# Patient Record
Sex: Female | Born: 1975 | Race: Black or African American | Hispanic: No | Marital: Married | Smoking: Never smoker
Health system: Southern US, Community
[De-identification: ages and names within clinical notes are randomized; demographics above are authoritative.]

## PROBLEM LIST (undated history)

## (undated) ENCOUNTER — Ambulatory Visit: Admission: EM | Payer: Self-pay | Source: Home / Self Care

## (undated) DIAGNOSIS — I10 Essential (primary) hypertension: Secondary | ICD-10-CM

## (undated) HISTORY — PX: HERNIA REPAIR: SHX51

## (undated) HISTORY — DX: Essential (primary) hypertension: I10

---

## 2007-11-28 HISTORY — PX: GASTRIC BYPASS: SHX52

## 2021-04-06 ENCOUNTER — Ambulatory Visit
Admission: EM | Admit: 2021-04-06 | Discharge: 2021-04-06 | Disposition: A | Discharge status: Home / Self Care | Payer: BC Managed Care – PPO | Attending: Emergency Medicine | Admitting: Emergency Medicine

## 2021-04-06 ENCOUNTER — Ambulatory Visit: Payer: BC Managed Care – PPO

## 2021-04-06 ENCOUNTER — Ambulatory Visit (INDEPENDENT_AMBULATORY_CARE_PROVIDER_SITE_OTHER): Payer: BC Managed Care – PPO

## 2021-04-06 ENCOUNTER — Other Ambulatory Visit: Payer: Self-pay

## 2021-04-06 DIAGNOSIS — T07XXXA Unspecified multiple injuries, initial encounter: Secondary | ICD-10-CM | POA: Diagnosis not present

## 2021-04-06 DIAGNOSIS — M545 Low back pain, unspecified: Secondary | ICD-10-CM | POA: Diagnosis not present

## 2021-04-06 DIAGNOSIS — M25562 Pain in left knee: Secondary | ICD-10-CM | POA: Diagnosis not present

## 2021-04-06 DIAGNOSIS — W01198A Fall on same level from slipping, tripping and stumbling with subsequent striking against other object, initial encounter: Secondary | ICD-10-CM | POA: Diagnosis not present

## 2021-04-06 DIAGNOSIS — M25552 Pain in left hip: Secondary | ICD-10-CM

## 2021-04-06 DIAGNOSIS — M25512 Pain in left shoulder: Secondary | ICD-10-CM | POA: Diagnosis not present

## 2021-04-06 DIAGNOSIS — M79644 Pain in right finger(s): Secondary | ICD-10-CM | POA: Diagnosis not present

## 2021-04-06 NOTE — ED Triage Notes (Signed)
Patient states that she was walking out of the grocery store around 9am this morning. States that she tripped over a speed bump. States that she landed on her left side. States that she has left shoulder pain, low back pain, left wrist pain and left hip pain.

## 2021-04-06 NOTE — Discharge Instructions (Addendum)
Use over-the-counter Tylenol and ibuprofen according to the package instructions as needed for pain.  Apply moist heat, or ice, to your areas of pain for 20 minutes at a time 2-3 times a day.  Rest is much as possible.  Return for reevaluation, or see your primary care provider, for any new or worsening symptoms.

## 2021-04-06 NOTE — ED Provider Notes (Signed)
[facility]    CSN: [id number] Arrival date & time: 04/06/21  1315      History   Chief Complaint Chief Complaint  Patient presents with  . Fall    HPI Lauren Wallace is a 45 y.o. female.   HPI   45 year old female here for evaluation of pain status post fall.  Patient reports that she was in the grocery store and tripped over a speed bump which caused her to fall onto the pavement and slide.  She reports that she mostly fell onto her left side and is complaining of left shoulder pain, left upper arm pain, left wrist pain, left hip pain, low back pain, and left knee pain.  She also reports some numbness in her left upper arm.  History reviewed. No pertinent past medical history.  There are no problems to display for this patient.   Past Surgical History:  Procedure Laterality Date  . GASTRIC BYPASS  2009    OB History   No obstetric history on file.      Home Medications    Prior to Admission medications   Not on File    Family History History reviewed. No pertinent family history.  Social History Social History   Tobacco Use  . Smoking status: Never Smoker  . Smokeless tobacco: Never Used  Vaping Use  . Vaping Use: Never used  Substance Use Topics  . Alcohol use: Never  . Drug use: Never     Allergies   Penicillins   Review of Systems Review of Systems  Constitutional: Negative for activity change, appetite change and fever.  Musculoskeletal: Positive for arthralgias, back pain and myalgias.  Skin: Negative for color change and wound.  Neurological: Positive for numbness. Negative for weakness.  Hematological: Negative.      Physical Exam Triage Vital Signs ED Triage Vitals  Enc Vitals Group     BP 04/06/21 1427 (!) 134/92     Pulse Rate 04/06/21 1427 84     Resp 04/06/21 1427 18     Temp 04/06/21 1427 98.4 F (36.9 C)     Temp Source 04/06/21 1427 Oral     SpO2 04/06/21 1427 100 %     Weight 04/06/21 1425 203 lb (92.1  kg)     Height 04/06/21 1425 5\' 7"  (1.702 m)     Head Circumference --      Peak Flow --      Pain Score 04/06/21 1424 8     Pain Loc --      Pain Edu? --      Excl. in GC? --    No data found.  Updated Vital Signs BP (!) 134/92 (BP Location: Right Arm)   Pulse 84   Temp 98.4 F (36.9 C) (Oral)   Resp 18   Ht 5\' 7"  (1.702 m)   Wt 203 lb (92.1 kg)   LMP 04/05/2021   SpO2 100%   BMI 31.79 kg/m   Visual Acuity Right Eye Distance:   Left Eye Distance:   Bilateral Distance:    Right Eye Near:   Left Eye Near:    Bilateral Near:     Physical Exam Vitals and nursing note reviewed.  Constitutional:      General: She is not in acute distress.    Appearance: Normal appearance. She is not ill-appearing.  HENT:     Head: Normocephalic and atraumatic.  Cardiovascular:     Rate and Rhythm: Normal rate and regular rhythm.  Pulses: Normal pulses.     Heart sounds: Normal heart sounds. No murmur heard. No gallop.   Pulmonary:     Effort: Pulmonary effort is normal.     Breath sounds: Normal breath sounds. No wheezing, rhonchi or rales.  Musculoskeletal:        General: Tenderness present. No swelling, deformity or signs of injury.  Skin:    General: Skin is warm and dry.     Capillary Refill: Capillary refill takes less than 2 seconds.     Findings: No bruising or erythema.  Neurological:     General: No focal deficit present.     Mental Status: She is alert and oriented to person, place, and time.  Psychiatric:        Mood and Affect: Mood normal.        Behavior: Behavior normal.        Thought Content: Thought content normal.        Judgment: Judgment normal.      UC Treatments / Results  Labs (all labs ordered are listed, but only abnormal results are displayed) Labs Reviewed - No data to display  EKG   Radiology DG Lumbar Spine Complete  Result Date: 04/06/2021 CLINICAL DATA:  Pain post fall EXAM: LUMBAR SPINE - COMPLETE 4+ VIEW COMPARISON:  None.  FINDINGS: Numerous phleboliths in the pelvis. Lumbar alignment within normal limits. Vertebral body heights are maintained. The disc spaces appear patent. Mild facet degenerative changes. IMPRESSION: No acute osseous abnormality. Electronically Signed   By: Jasmine Pang M.D.   On: 04/06/2021 16:01   DG Shoulder Left  Result Date: 04/06/2021 CLINICAL DATA:  Trip and fall this morning with left shoulder pain, initial encounter EXAM: LEFT SHOULDER - 2+ VIEW COMPARISON:  None. FINDINGS: No acute fracture or dislocation is noted. No soft tissue abnormality is noted. Small calcification is noted along the inferior aspect of the glenoid of uncertain chronicity. No other focal abnormality is noted. IMPRESSION: No acute abnormality seen. Electronically Signed   By: Alcide Clever M.D.   On: 04/06/2021 16:01   DG Knee Complete 4 Views Left  Result Date: 04/06/2021 CLINICAL DATA:  Recent trip and fall with left knee pain, initial encounter EXAM: LEFT KNEE - COMPLETE 4+ VIEW COMPARISON:  None. FINDINGS: No acute fracture or dislocation is noted. Mild medial joint space narrowing is noted with mild osteophytic change. No other focal abnormality is seen. IMPRESSION: Mild degenerative change without acute abnormality. Electronically Signed   By: Alcide Clever M.D.   On: 04/06/2021 16:02   DG Humerus Left  Result Date: 04/06/2021 CLINICAL DATA:  Pain post fall EXAM: LEFT HUMERUS - 2+ VIEW COMPARISON:  None. FINDINGS: There is no evidence of fracture or other focal bone lesions. Soft tissues are unremarkable. IMPRESSION: Negative. Electronically Signed   By: Jasmine Pang M.D.   On: 04/06/2021 16:00   DG Finger Little Right  Result Date: 04/06/2021 CLINICAL DATA:  Recent trip and fall with fifth digit pain, initial encounter EXAM: RIGHT LITTLE FINGER 2+V COMPARISON:  None. FINDINGS: There is no evidence of fracture or dislocation. There is no evidence of arthropathy or other focal bone abnormality. Soft tissues are  unremarkable. IMPRESSION: No acute abnormality noted. Electronically Signed   By: Alcide Clever M.D.   On: 04/06/2021 16:03   DG Hip Unilat W or Wo Pelvis 2-3 Views Left  Result Date: 04/06/2021 CLINICAL DATA:  Recent trip and fall with left hip pain, initial encounter EXAM: DG HIP (  WITH OR WITHOUT PELVIS) 3V LEFT COMPARISON:  None. FINDINGS: Pelvic ring is intact. Multiple phleboliths are noted. No acute fracture or dislocation is noted. No soft tissue abnormality is seen. IMPRESSION: No acute abnormality noted. Electronically Signed   By: Alcide Clever M.D.   On: 04/06/2021 16:02    Procedures Procedures (including critical care time)  Medications Ordered in UC Medications - No data to display  Initial Impression / Assessment and Plan / UC Course  I have reviewed the triage vital signs and the nursing notes.  Pertinent labs & imaging results that were available during my care of the patient were reviewed by me and considered in my medical decision making (see chart for details).   Patient is a very pleasant, nontoxic-appearing 45 year old female who is here for evaluation of left shoulder pain, left upper arm pain, left wrist pain, low back pain, left hip pain, and right fifth finger pain.  Patient reports that she was walking through the parking lot after exenteration store, tripped over a speed bump, and fell on the macadam.  She is complaining of pain in the upper part of her left shoulder, left upper arm, left hip, low back, left knee, and right fifth finger.  There is no abnormalities to anatomical alignment, ecchymosis, erythema, or edema.  There are no abrasions present on any of her skin surfaces.  Physical exam reveals the following.  Left shoulder: Patient is complaining of tenderness to distal aspect of the left clavicle but there is no crepitus, edema, erythema, or ecchymosis present.  No tenderness with palpation of the acromion process, scapula, or scapular spine.  Patient does have  tenderness with palpation of the superior aspect the left trapezius.  The left shoulder has full range of motion with patient complaining of pain at end of extension, and at 90 degrees of abduction.  Left elbow: No tenderness to palpation to medial or lateral epicondyle or olecranon process.  Patient has no pain when putting elbow through full range of motion.  Right finger: Patient is complaining of tenderness at the MCP joint of the right fifth finger.  There is no abnormalities to the anatomical alignment, edema, ecchymosis, or erythema.  No crepitus to palpation.  No abrasions to the skin noted.  Lumbar spine: Patient has mild tenderness to the midline when palpating the lumbar spine over L3, L4, and L5.  There is also left paraspinous tenderness and mild spasm present.  Left hip: Patient is full range of motion left hip with tenderness with palpation over the greater trochanter.  There is no ecchymosis, erythema, or edema noted.  Left knee: Patient complaining of pain over the patella to palpation.  There is no crepitus noted.  No tenderness to the quadricep complex, the tibial tuberosity, or over the patellar tendon.  Patient is complaining of pain in the lateral joint line of the left knee as well as the popliteal fossa.  There is no effusion present.  Patient denies pain with valgus stress application but does complain with varus stress application over the medial aspect of the knee.  We will obtain radiographs of right for finger, left shoulder, left humerus, lumbar spine, left hip, and left knee.  Left Humerus films reviewed and independently evaluated by me.  Interpretation: No evidence of fracture or dislocation.  Lumbar spine films reviewed and independently evaluated by me.  Interpretation: No evidence of fracture or dislocation of the lumbar spine.  Left knee films reviewed and independently evaluated by me.  Interpretation:  No evidence of fracture or dislocation.  AP pelvis and  left hip films reviewed and independently evaluated by me.  Interpretation: No evidence of fracture or dislocation.  Multiple phleboliths and pelvis.  Right fifth finger films reviewed independently evaluated by me.  Interpretation: No evidence of fracture or dislocation normal anatomical alignment.  Left shoulder films reviewed and independently evaluated by me.  Interpretation: No evidence of fracture or dislocation of the glenohumeral joint.  Clavicle and acromion process appear normal.  Awaiting radiology overread on above films.  Radiology interpretation for all films is that they are negative for fracture or dislocation.  Lumbar spine shows mild degenerative changes without focal abnormality.  Will discharge patient home with diagnosis of multiple contusions have her use over-the-counter Tylenol and ibuprofen as needed for pain as well as ice or moist heat.   Final Clinical Impressions(s) / UC Diagnoses   Final diagnoses:  Multiple contusions   Discharge Instructions   None    ED Prescriptions    None     I have reviewed the PDMP during this encounter.   Becky Augusta, NP 04/06/21 1615

## 2021-07-10 ENCOUNTER — Encounter (HOSPITAL_COMMUNITY): Payer: Self-pay

## 2021-07-10 ENCOUNTER — Other Ambulatory Visit: Payer: Self-pay

## 2021-07-10 ENCOUNTER — Emergency Department (HOSPITAL_COMMUNITY): Payer: BC Managed Care – PPO

## 2021-07-10 ENCOUNTER — Observation Stay (HOSPITAL_COMMUNITY): Payer: BC Managed Care – PPO

## 2021-07-10 ENCOUNTER — Observation Stay (HOSPITAL_COMMUNITY)
Admission: EM | Admit: 2021-07-10 | Discharge: 2021-07-12 | Disposition: A | Discharge status: Home / Self Care | Payer: BC Managed Care – PPO | Attending: Surgery | Admitting: Surgery

## 2021-07-10 DIAGNOSIS — K562 Volvulus: Secondary | ICD-10-CM | POA: Diagnosis not present

## 2021-07-10 DIAGNOSIS — Z0189 Encounter for other specified special examinations: Secondary | ICD-10-CM

## 2021-07-10 DIAGNOSIS — K56609 Unspecified intestinal obstruction, unspecified as to partial versus complete obstruction: Secondary | ICD-10-CM

## 2021-07-10 DIAGNOSIS — R1084 Generalized abdominal pain: Secondary | ICD-10-CM

## 2021-07-10 DIAGNOSIS — Z9884 Bariatric surgery status: Secondary | ICD-10-CM

## 2021-07-10 DIAGNOSIS — Z79899 Other long term (current) drug therapy: Secondary | ICD-10-CM | POA: Diagnosis not present

## 2021-07-10 DIAGNOSIS — Z20822 Contact with and (suspected) exposure to covid-19: Secondary | ICD-10-CM | POA: Diagnosis not present

## 2021-07-10 LAB — CBC WITH DIFFERENTIAL/PLATELET
Abs Immature Granulocytes: 0.02 10*3/uL (ref 0.00–0.07)
Basophils Absolute: 0 10*3/uL (ref 0.0–0.1)
Basophils Relative: 0 %
Eosinophils Absolute: 0 10*3/uL (ref 0.0–0.5)
Eosinophils Relative: 0 %
HCT: 39.1 % (ref 36.0–46.0)
Hemoglobin: 12.3 g/dL (ref 12.0–15.0)
Immature Granulocytes: 0 %
Lymphocytes Relative: 17 %
Lymphs Abs: 1.8 10*3/uL (ref 0.7–4.0)
MCH: 26.7 pg (ref 26.0–34.0)
MCHC: 31.5 g/dL (ref 30.0–36.0)
MCV: 85 fL (ref 80.0–100.0)
Monocytes Absolute: 0.7 10*3/uL (ref 0.1–1.0)
Monocytes Relative: 6 %
Neutro Abs: 8 10*3/uL — ABNORMAL HIGH (ref 1.7–7.7)
Neutrophils Relative %: 77 %
Platelets: 333 10*3/uL (ref 150–400)
RBC: 4.6 MIL/uL (ref 3.87–5.11)
RDW: 14.9 % (ref 11.5–15.5)
WBC: 10.6 10*3/uL — ABNORMAL HIGH (ref 4.0–10.5)
nRBC: 0 % (ref 0.0–0.2)

## 2021-07-10 LAB — COMPREHENSIVE METABOLIC PANEL
ALT: 17 U/L (ref 0–44)
AST: 23 U/L (ref 15–41)
Albumin: 4.3 g/dL (ref 3.5–5.0)
Alkaline Phosphatase: 51 U/L (ref 38–126)
Anion gap: 13 (ref 5–15)
BUN: 11 mg/dL (ref 6–20)
CO2: 23 mmol/L (ref 22–32)
Calcium: 9.4 mg/dL (ref 8.9–10.3)
Chloride: 101 mmol/L (ref 98–111)
Creatinine, Ser: 0.71 mg/dL (ref 0.44–1.00)
GFR, Estimated: >60 mL/min (ref >60)
Glucose, Bld: 184 mg/dL — ABNORMAL HIGH (ref 70–99)
Potassium: 3.5 mmol/L (ref 3.5–5.1)
Sodium: 137 mmol/L (ref 135–145)
Total Bilirubin: 0.6 mg/dL (ref 0.3–1.2)
Total Protein: 8.3 g/dL — ABNORMAL HIGH (ref 6.5–8.1)

## 2021-07-10 LAB — URINALYSIS, ROUTINE W REFLEX MICROSCOPIC
Bilirubin Urine: NEGATIVE
Glucose, UA: NEGATIVE mg/dL
Ketones, ur: 40 mg/dL — AB
Leukocytes,Ua: NEGATIVE
Nitrite: NEGATIVE
Protein, ur: NEGATIVE mg/dL
Specific Gravity, Urine: >1.03 — ABNORMAL HIGH (ref 1.005–1.030)
pH: 6 (ref 5.0–8.0)

## 2021-07-10 LAB — URINALYSIS, MICROSCOPIC (REFLEX): RBC / HPF: >50 RBC/hpf (ref 0–5)

## 2021-07-10 LAB — RESP PANEL BY RT-PCR (FLU A&B, COVID) ARPGX2
Influenza A by PCR: NEGATIVE
Influenza B by PCR: NEGATIVE
SARS Coronavirus 2 by RT PCR: NEGATIVE

## 2021-07-10 LAB — LACTIC ACID, PLASMA
Lactic Acid, Venous: 1.4 mmol/L (ref 0.5–1.9)
Lactic Acid, Venous: 0.7 mmol/L (ref 0.5–1.9)

## 2021-07-10 LAB — I-STAT BETA HCG BLOOD, ED (MC, WL, AP ONLY): I-stat hCG, quantitative: <5 m[IU]/mL (ref <5)

## 2021-07-10 MED ORDER — FENTANYL CITRATE (PF) 100 MCG/2ML IJ SOLN
50.0000 ug | Freq: Once | INTRAMUSCULAR | Status: AC
  Administered 2021-07-10: 50 ug via INTRAVENOUS
  Filled 2021-07-10: qty 2

## 2021-07-10 MED ORDER — IOHEXOL 350 MG/ML SOLN
80.0000 mL | Freq: Once | INTRAVENOUS | Status: AC | PRN
  Administered 2021-07-10: 80 mL via INTRAVENOUS

## 2021-07-10 MED ORDER — ONDANSETRON HCL 4 MG/2ML IJ SOLN
4.0000 mg | Freq: Four times a day (QID) | INTRAMUSCULAR | Status: DC | PRN
  Administered 2021-07-10: 4 mg via INTRAVENOUS
  Filled 2021-07-10: qty 2

## 2021-07-10 MED ORDER — KCL IN DEXTROSE-NACL 20-5-0.45 MEQ/L-%-% IV SOLN
INTRAVENOUS | Status: DC
  Filled 2021-07-10 (×4): qty 1000

## 2021-07-10 MED ORDER — LIP MEDEX EX OINT
TOPICAL_OINTMENT | CUTANEOUS | Status: AC
  Filled 2021-07-10: qty 7

## 2021-07-10 MED ORDER — ONDANSETRON 4 MG PO TBDP: 4.0000 mg | ORAL_TABLET | Freq: Four times a day (QID) | ORAL | Status: DC | PRN

## 2021-07-10 MED ORDER — HEPARIN SODIUM (PORCINE) 5000 UNIT/ML IJ SOLN
5000.0000 [IU] | Freq: Three times a day (TID) | INTRAMUSCULAR | Status: DC
  Administered 2021-07-10 – 2021-07-12 (×5): 5000 [IU] via SUBCUTANEOUS
  Filled 2021-07-10 (×6): qty 1

## 2021-07-10 MED ORDER — HYDRALAZINE HCL 20 MG/ML IJ SOLN: 10.0000 mg | INTRAMUSCULAR | Status: DC | PRN

## 2021-07-10 MED ORDER — SODIUM CHLORIDE 0.9 % IV BOLUS
1000.0000 mL | Freq: Once | INTRAVENOUS | Status: AC
  Administered 2021-07-10: 1000 mL via INTRAVENOUS

## 2021-07-10 MED ORDER — PANTOPRAZOLE SODIUM 40 MG IV SOLR
40.0000 mg | Freq: Every day | INTRAVENOUS | Status: DC
  Administered 2021-07-10 – 2021-07-11 (×2): 40 mg via INTRAVENOUS
  Filled 2021-07-10 (×2): qty 40

## 2021-07-10 MED ORDER — MORPHINE SULFATE (PF) 2 MG/ML IV SOLN
1.0000 mg | INTRAVENOUS | Status: DC | PRN
  Administered 2021-07-10 – 2021-07-11 (×2): 1 mg via INTRAVENOUS
  Filled 2021-07-10 (×2): qty 1

## 2021-07-10 MED ORDER — DIATRIZOATE MEGLUMINE & SODIUM 66-10 % PO SOLN
90.0000 mL | Freq: Once | ORAL | Status: AC
  Administered 2021-07-10: 90 mL via ORAL
  Filled 2021-07-10: qty 90

## 2021-07-10 NOTE — ED Notes (Signed)
Telephone report given to [facility] on 3rd floor

## 2021-07-10 NOTE — H&P (Signed)
Chief Complaint: Abdominal pain  History of Present Illness:  Lauren Wallace is an 45 y.o. female who underwent a gastric bypass in 2009 by Dr. Adele Dan at [facility].  She has had 1 other laparoscopy by Dr. Lorie Phenix.  She presents today with a 2-day history of some lower abdominal pain.  This began yesterday and she was getting ready to go out of town this morning.  [place] and had [place] and got sick.  She had a bowel movement and was nauseated but was unable to vomit.  She did not feel well and was brought to the emergency department.  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  The CT was reviewed viewed by Dr. Corliss Skains and myself.  Patient was seen by me in the ED and she appeared comfortable.  Her abdomen was flat and not distended.  There is no peritoneal signs.  She was a little sensitive in the left lower quadrant but no rebound or guarding and was not that remarkable of an exam.  She is not passed any flatus this morning.  I asked her about how she felt and how she feels about going straight to the OR because of radiologic signs or if she wants to be followed for a bit and see if she gets better.  Presently she would like to be observed.  We may put her on the small bowel protocol and give her some oral contrast and then follow-up with some x-rays and see if that goes through her system.  We will plan hydration and admission to the [facility] service.  History reviewed. No pertinent past medical history.  Past Surgical History:  Procedure Laterality Date   GASTRIC BYPASS  2009    No current facility-administered medications for this encounter.   Current Outpatient Medications  Medication Sig Dispense Refill   acetaminophen (TYLENOL) 500 MG tablet Take 1,000 mg by mouth every 6 (six) hours as needed for mild pain or headache.     azelastine (ASTELIN) 0.1 % nasal spray Place 1 spray into both  nostrils 2 (two) times daily as needed for rhinitis or allergies.     Cholecalciferol (VITAMIN D3) 25 MCG (1000 UT) CAPS Take 1,000 Units by mouth daily.     fexofenadine (ALLEGRA) 180 MG tablet Take 180 mg by mouth daily as needed for allergies.     hydrochlorothiazide (HYDRODIURIL) 12.5 MG tablet Take 12.5 mg by mouth daily.     Multiple Vitamin (MULTI-VITAMIN) tablet Take 1 tablet by mouth daily.     phentermine (ADIPEX-P) 37.5 MG tablet Take 37.5 mg by mouth daily. Takes for 42 days, then stops for 28 days, then restart cycle.     Semaglutide-Weight Management 2.4 MG/0.75ML SOAJ Inject 2.4 mg into the skin once a week. Wednesday's     vitamin B-12 (CYANOCOBALAMIN) 1000 MCG tablet Take 1,000 mcg by mouth daily.     Labetalol and Penicillins History reviewed. No pertinent family history. Social History:   reports that she has never smoked. She has never used smokeless tobacco. She reports that she does not drink alcohol and does not use drugs.   REVIEW OF SYSTEMS : Negative except for recent weight loss with phentermine  Physical Exam:   Blood pressure (!) 144/87, pulse 100, temperature (!) 97.4 F (36.3 C), resp. rate 16, SpO2 100 %. There is no height or weight on file  to calculate BMI.  Gen:  WDWN AAF NAD  Neurological: Alert and oriented to person, place, and time. Motor and sensory function is grossly intact  Head: Normocephalic and atraumatic.  Eyes: Conjunctivae are normal. Pupils are equal, round, and reactive to light. No scleral icterus.  Neck: Normal range of motion. Neck supple. No tracheal deviation or thyromegaly present.  Cardiovascular:  SR without murmurs or gallops.  No carotid bruits Breast:  not examined Respiratory: Effort normal.  No respiratory distress. No chest wall tenderness. Breath sounds normal.  No wheezes, rales or rhonchi.  Abdomen:  flat and slight tenderness in the LLQ GU:  not examined Musculoskeletal: Normal range of motion. Extremities are  nontender. No cyanosis, edema or clubbing noted Lymphadenopathy: No cervical, preauricular, postauricular or axillary adenopathy is present Skin: Skin is warm and dry. No rash noted. No diaphoresis. No erythema. No pallor. Pscyh: Normal mood and affect. Behavior is normal. Judgment and thought content normal.   LABORATORY RESULTS: Results for orders placed or performed during the hospital encounter of 07/10/21 (from the past 48 hour(s))  CBC with Differential     Status: Abnormal   Collection Time: 07/10/21  5:45 AM  Result Value Ref Range   WBC 10.6 (H) 4.0 - 10.5 K/uL   RBC 4.60 3.87 - 5.11 MIL/uL   Hemoglobin 12.3 12.0 - 15.0 g/dL   HCT 28.439.1 13.236.0 - 44.046.0 %   MCV 85.0 80.0 - 100.0 fL   MCH 26.7 26.0 - 34.0 pg   MCHC 31.5 30.0 - 36.0 g/dL   RDW 10.214.9 72.511.5 - 36.615.5 %   Platelets 333 150 - 400 K/uL   nRBC 0.0 0.0 - 0.2 %   Neutrophils Relative % 77 %   Neutro Abs 8.0 (H) 1.7 - 7.7 K/uL   Lymphocytes Relative 17 %   Lymphs Abs 1.8 0.7 - 4.0 K/uL   Monocytes Relative 6 %   Monocytes Absolute 0.7 0.1 - 1.0 K/uL   Eosinophils Relative 0 %   Eosinophils Absolute 0.0 0.0 - 0.5 K/uL   Basophils Relative 0 %   Basophils Absolute 0.0 0.0 - 0.1 K/uL   Immature Granulocytes 0 %   Abs Immature Granulocytes 0.02 0.00 - 0.07 K/uL    Comment: Performed at [facility], [address] [place], [place] [address]  Comprehensive metabolic panel     Status: Abnormal   Collection Time: 07/10/21  5:45 AM  Result Value Ref Range   Sodium 137 135 - 145 mmol/L   Potassium 3.5 3.5 - 5.1 mmol/L   Chloride 101 98 - 111 mmol/L   CO2 23 22 - 32 mmol/L   Glucose, Bld 184 (H) 70 - 99 mg/dL    Comment: Glucose reference range applies only to samples taken after fasting for at least 8 hours.   BUN 11 6 - 20 mg/dL   Creatinine, Ser 7.420.71 0.44 - 1.00 mg/dL   Calcium 9.4 8.9 - 59.510.3 mg/dL   Total Protein 8.3 (H) 6.5 - 8.1 g/dL   Albumin 4.3 3.5 - 5.0 g/dL   AST 23 15 - 41 U/L   ALT 17 0 - 44  U/L   Alkaline Phosphatase 51 38 - 126 U/L   Total Bilirubin 0.6 0.3 - 1.2 mg/dL   GFR, Estimated >63>60 >87>60 mL/min    Comment: (NOTE) Calculated using the CKD-EPI Creatinine Equation (2021)    Anion gap 13 5 - 15    Comment: Performed at [facility], [address] [place], [place] [address]  I-Stat Beta hCG blood, ED ([facility], [facility], [facility] only)     Status: None   Collection Time: 07/10/21  5:56 AM  Result Value Ref Range   I-stat hCG, quantitative <5.0 <5 mIU/mL   Comment 3            Comment:   GEST. AGE      CONC.  (mIU/mL)   <=1 WEEK        5 - 50     2 WEEKS       50 - 500     3 WEEKS       100 - 10,000     4 WEEKS     1,000 - 30,000        FEMALE AND NON-PREGNANT FEMALE:     LESS THAN 5 mIU/mL   Urinalysis, Routine w reflex microscopic Urine, Clean Catch     Status: Abnormal   Collection Time: 07/10/21  7:36 AM  Result Value Ref Range   Color, Urine YELLOW YELLOW   APPearance CLEAR CLEAR   Specific Gravity, Urine >1.030 (H) 1.005 - 1.030   pH 6.0 5.0 - 8.0   Glucose, UA NEGATIVE NEGATIVE mg/dL   Hgb urine dipstick LARGE (A) NEGATIVE   Bilirubin Urine NEGATIVE NEGATIVE   Ketones, ur 40 (A) NEGATIVE mg/dL   Protein, ur NEGATIVE NEGATIVE mg/dL   Nitrite NEGATIVE NEGATIVE   Leukocytes,Ua NEGATIVE NEGATIVE    Comment: Performed at [facility], [address] [place], [place] [address]  Urinalysis, Microscopic (reflex)     Status: Abnormal   Collection Time: 07/10/21  7:36 AM  Result Value Ref Range   RBC / HPF >50 0 - 5 RBC/hpf   WBC, UA 0-5 0 - 5 WBC/hpf   Bacteria, UA RARE (A) NONE SEEN   Squamous Epithelial / LPF 0-5 0 - 5   Mucus PRESENT     Comment: Performed at [facility], [address] [place], [place] [address]  Lactic acid, plasma     Status: None   Collection Time: 07/10/21 10:41 AM  Result Value Ref Range   Lactic Acid, Venous 1.4 0.5 - 1.9 mmol/L    Comment: Performed at [facility],  [address] [place], [place] [address]     RADIOLOGY RESULTS: CT ABDOMEN PELVIS W CONTRAST  Addendum Date: 07/10/2021   ADDENDUM REPORT: 07/10/2021 10:16 ADDENDUM: These results were called by telephone at the time of interpretation on 07/10/2021 at 10:13 am to provider [facility] , who verbally acknowledged these results. Electronically Signed   By: Romona Curls M.D.   On: 07/10/2021 10:16   Result Date: 07/10/2021 CLINICAL DATA:  Lower abdominal pain, history of gastric bypass surgery. Concern for abdominal abscess and bowel obstruction. EXAM: CT ABDOMEN AND PELVIS WITH CONTRAST TECHNIQUE: Multidetector CT imaging of the abdomen and pelvis was performed using the standard protocol following bolus administration of intravenous contrast. CONTRAST:  46mL OMNIPAQUE IOHEXOL 350 MG/ML SOLN COMPARISON:  None. FINDINGS: Lower chest: No acute abnormality. Hepatobiliary: No focal liver abnormality is seen. No gallstones, gallbladder wall thickening, or biliary dilatation. Pancreas: Unremarkable. No pancreatic ductal dilatation or surrounding inflammatory changes. Spleen: Normal in size without focal abnormality. Adrenals/Urinary Tract: Adrenal glands are unremarkable. Kidneys are normal, without renal calculi, focal lesion, or hydronephrosis. Bladder is unremarkable. Stomach/Bowel: The patient is status post gastric bypass surgery. No pericecal inflammatory changes are noted to suggest acute appendicitis. No  evidence of bowel wall thickening, distention, or inflammatory changes. Vascular/Lymphatic: There is a swirling motion of the superior mesenteric vein around the superior mesenteric artery with engorgement of multiple superior mesenteric vein branches and apparent narrowing of the vein closer to its origin (series 2 images 35-50). There appears to be mesenteric edema surrounding a portion of one of the superior mesenteric vein branches (likely the middle or right colic vein as it rotates around the  superior mesenteric artery and is located to the left of the superior mesenteric artery on series 2, images 38-42). No enlarged abdominal or pelvic lymph nodes are identified. Reproductive: A uterine fibroid is noted. Other: No abdominal wall hernia or abnormality. No abdominopelvic ascites. Musculoskeletal: No acute or significant osseous findings. IMPRESSION: Swirling motion of the superior mesenteric vein and its branches around the superior mesenteric artery with engorgement of multiple superior mesenteric vein branches and some associated mesenteric edema. 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 Electronically Signed: By: Romona Curls M.D. On: 07/10/2021 09:58    Problem List: Patient Active Problem List   Diagnosis Date Noted   Gastric bypass status for obesity at [facility] 2009 07/10/2021    Assessment & Plan: Will admit to [facility]CS service for observation; small bowel protocol.  Possible laparoscopy or laparotomy    Matt B. Daphine Deutscher, MD, [facility] [phone] beeper [phone]  07/10/2021 11:29 AM

## 2021-07-10 NOTE — ED Triage Notes (Signed)
Pt BIB EMS. Pt has lower abdominal pain, hx of gastric bypass. 50 mcg Fentanyl given by EMS.

## 2021-07-10 NOTE — ED Notes (Signed)
Unable to obtain oral or axillary temperature. I offered to obtain a rectal temperature on the patient, but she declined.

## 2021-07-10 NOTE — ED Notes (Signed)
Ambulatory to restroom to provide urine sample.  

## 2021-07-10 NOTE — ED Provider Notes (Signed)
[facility]-EMERGENCY DEPT Provider Note   CSN: [id number] Arrival date & time: 07/10/21  0535     History Chief Complaint  Patient presents with   Abdominal Pain    Lauren Wallace is a 45 y.o. female history of gastric bypass surgery 2010 presents today for abdominal pain nausea vomiting.  Patient reports symptom onset yesterday after eating a hot dog.  Pain became worse today while [place] was at the airport [place] a flight.  She describes periumbilical abdominal pain initially radiated to her lower abdomen has since moved to her upper abdomen, pain was severe and constant no clear aggravating factors, pain improved somewhat with fentanyl prior to arrival.  This is associated with several episodes of nonbloody/nonbilious emesis.  Patient reports her last bowel movement was this morning.  Denies fever/chills, fall/injury, chest pain/shortness of breath, recent illness or any additional concerns  HPI     History reviewed. No pertinent past medical history.  Patient Active Problem List   Diagnosis Date Noted   Gastric bypass status for obesity at [facility] 2009 07/10/2021    Past Surgical History:  Procedure Laterality Date   GASTRIC BYPASS  2009     OB History   No obstetric history on file.     History reviewed. No pertinent family history.  Social History   Tobacco Use   Smoking status: Never   Smokeless tobacco: Never  Vaping Use   Vaping Use: Never used  Substance Use Topics   Alcohol use: Never   Drug use: Never    Home Medications Prior to Admission medications   Medication Sig Start Date End Date Taking? Authorizing Provider  acetaminophen (TYLENOL) 500 MG tablet Take 1,000 mg by mouth every 6 (six) hours as needed for mild pain or headache.   Yes [provider]  azelastine (ASTELIN) 0.1 % nasal spray Place 1 spray into both nostrils 2 (two) times daily as needed for rhinitis or allergies.   Yes [provider]   Cholecalciferol (VITAMIN D3) 25 MCG (1000 UT) CAPS Take 1,000 Units by mouth daily.   Yes [provider]  fexofenadine (ALLEGRA) 180 MG tablet Take 180 mg by mouth daily as needed for allergies.   Yes [provider]  hydrochlorothiazide (HYDRODIURIL) 12.5 MG tablet Take 12.5 mg by mouth daily. 07/01/21  Yes [provider]  Multiple Vitamin (MULTI-VITAMIN) tablet Take 1 tablet by mouth daily.   Yes [provider]  phentermine (ADIPEX-P) 37.5 MG tablet Take 37.5 mg by mouth daily. Takes for 42 days, then stops for 28 days, then restart cycle. 05/27/21  Yes [provider]  Semaglutide-Weight Management 2.4 MG/0.75ML SOAJ Inject 2.4 mg into the skin once a week. Wednesday's 02/17/21  Yes [provider]  vitamin B-12 (CYANOCOBALAMIN) 1000 MCG tablet Take 1,000 mcg by mouth daily.   Yes [provider]    Allergies    Labetalol and Penicillins  Review of Systems   Review of Systems Ten systems are reviewed and are negative for acute change except as noted in the HPI   Physical Exam Updated Vital Signs BP (!) 151/97   Pulse (!) 101   Temp (!) 97.4 F (36.3 C)   Resp 16   SpO2 100%   Physical Exam Constitutional:      General: She is not in acute distress.    Appearance: Normal appearance. She is well-developed. She is not ill-appearing or diaphoretic.  HENT:     Head: Normocephalic and atraumatic.  Eyes:     General: Vision grossly intact. Gaze aligned appropriately.     Pupils: Pupils are equal, round, and reactive to light.  Neck:     Trachea: Trachea and phonation normal.  Pulmonary:     Effort: Pulmonary effort is normal. No respiratory distress.  Abdominal:     General: There is no distension.     Palpations: Abdomen is soft.     Tenderness: There is generalized abdominal tenderness. There is no guarding or rebound. Negative signs include Murphy's sign.  Musculoskeletal:        General: Normal range of motion.      Cervical back: Normal range of motion.  Skin:    General: Skin is warm and dry.  Neurological:     Mental Status: She is alert.     GCS: GCS eye subscore is 4. GCS verbal subscore is 5. GCS motor subscore is 6.     Comments: Speech is clear and goal oriented, follows commands Major Cranial nerves without deficit, no facial droop Moves extremities without ataxia, coordination intact  Psychiatric:        Behavior: Behavior normal.    ED Results / Procedures / Treatments   Labs (all labs ordered are listed, but only abnormal results are displayed) Labs Reviewed  CBC WITH DIFFERENTIAL/PLATELET - Abnormal; Notable for the following components:      Result Value   WBC 10.6 (*)    Neutro Abs 8.0 (*)    All other components within normal limits  COMPREHENSIVE METABOLIC PANEL - Abnormal; Notable for the following components:   Glucose, Bld 184 (*)    Total Protein 8.3 (*)    All other components within normal limits  URINALYSIS, ROUTINE W REFLEX MICROSCOPIC - Abnormal; Notable for the following components:   Specific Gravity, Urine >1.030 (*)    Hgb urine dipstick LARGE (*)    Ketones, ur 40 (*)    All other components within normal limits  URINALYSIS, MICROSCOPIC (REFLEX) - Abnormal; Notable for the following components:   Bacteria, UA RARE (*)    All other components within normal limits  RESP PANEL BY RT-PCR (FLU A&B, COVID) ARPGX2  LACTIC ACID, PLASMA  LACTIC ACID, PLASMA  I-STAT BETA HCG BLOOD, ED ([facility], [facility], [facility] ONLY)    EKG None  Radiology CT ABDOMEN PELVIS W CONTRAST  Addendum Date: 07/10/2021   ADDENDUM REPORT: 07/10/2021 10:16 ADDENDUM: These results were called by telephone at the time of interpretation on 07/10/2021 at 10:13 am to provider [facility] , who verbally acknowledged these results. Electronically Signed   By: Romona Curls M.D.   On: 07/10/2021 10:16   Result Date: 07/10/2021 CLINICAL DATA:  Lower abdominal pain, history of gastric bypass surgery.  Concern for abdominal abscess and bowel obstruction. EXAM: CT ABDOMEN AND PELVIS WITH CONTRAST TECHNIQUE: Multidetector CT imaging of the abdomen and pelvis was performed using the standard protocol following bolus administration of intravenous contrast. CONTRAST:  40mL OMNIPAQUE IOHEXOL 350 MG/ML SOLN COMPARISON:  None. FINDINGS: Lower chest: No acute abnormality. Hepatobiliary: No focal liver abnormality is seen. No gallstones, gallbladder wall thickening, or biliary dilatation. Pancreas: Unremarkable. No pancreatic ductal dilatation or surrounding inflammatory changes. Spleen: Normal in size without focal abnormality. Adrenals/Urinary Tract: Adrenal glands are unremarkable. Kidneys are normal, without renal calculi, focal lesion, or hydronephrosis. Bladder is unremarkable. Stomach/Bowel: The patient is status post gastric bypass surgery. No pericecal inflammatory changes are noted to suggest acute appendicitis. No evidence of bowel wall thickening, distention, or  inflammatory changes. Vascular/Lymphatic: There is a swirling motion of the superior mesenteric vein around the superior mesenteric artery with engorgement of multiple superior mesenteric vein branches and apparent narrowing of the vein closer to its origin (series 2 images 35-50). There appears to be mesenteric edema surrounding a portion of one of the superior mesenteric vein branches (likely the middle or right colic vein as it rotates around the superior mesenteric artery and is located to the left of the superior mesenteric artery on series 2, images 38-42). No enlarged abdominal or pelvic lymph nodes are identified. Reproductive: A uterine fibroid is noted. Other: No abdominal wall hernia or abnormality. No abdominopelvic ascites. Musculoskeletal: No acute or significant osseous findings. IMPRESSION: Swirling motion of the superior mesenteric vein and its branches around the superior mesenteric artery with engorgement of multiple superior  mesenteric vein branches and some associated mesenteric edema. 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 Electronically Signed: By: Romona Curls M.D. On: 07/10/2021 09:58    Procedures Procedures   Medications Ordered in ED Medications  sodium chloride 0.9 % bolus 1,000 mL (0 mLs Intravenous Stopped 07/10/21 1045)  fentaNYL (SUBLIMAZE) injection 50 mcg (50 mcg Intravenous Given 07/10/21 0812)  iohexol (OMNIPAQUE) 350 MG/ML injection 80 mL (80 mLs Intravenous Contrast Given 07/10/21 0856)    ED Course  I have reviewed the triage vital signs and the nursing notes.  Pertinent labs & imaging results that were available during my care of the patient were reviewed by me and considered in my medical decision making (see chart for details).  Clinical Course as of 07/10/21 1138  Sun Jul 10, 2021  0730 Patient not in room for evaluation [BM]    Clinical Course User Index [BM] Elizabeth Palau   MDM Rules/Calculators/A&P                           Additional history obtained from: Nursing notes from this visit. Family, husband at bedside. =============== I ordered, reviewed and interpreted labs which include: Pregnancy test negative. CBC shows leukocytosis of 10.6, no anemia or thrombocytopenia. CMP shows no emergent lecture derangement, AKI, LFT elevations or gap. Urinalysis shows hemoglobin and ketones, patient reports recently starting her menstrual cycle.  Doubt UTI.  CTAP:    IMPRESSION:  Swirling motion of the superior mesenteric vein and its branches  around the superior mesenteric artery with engorgement of multiple  superior mesenteric vein branches and some associated mesenteric  edema. 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  ----------- Patient was reassessed resting comfortably no acute distress pain improved while here in the ED.  Consult called to general surgery spoke with Dr. Corliss Skains who  advised addition of lactic and COVID test and will be by to see the patient.    Note: Portions of this report may have been transcribed using voice recognition software. Every effort was made to ensure accuracy; however, inadvertent computerized transcription errors may still be present.  Final Clinical Impression(s) / ED Diagnoses Final diagnoses:  Generalized abdominal pain    Rx / DC Orders ED Discharge Orders     None        Elizabeth Palau 07/10/21 1138    Mancel Bale, MD 07/11/21 1009

## 2021-07-10 NOTE — ED Notes (Addendum)
I attempted multiple times, oral and axillary to obtain a temperature without success, even using different monitors.  Pt would not keep self still or listen to instruction to hold tongue still.

## 2021-07-11 LAB — CBC WITH DIFFERENTIAL/PLATELET
Abs Immature Granulocytes: 0.02 10*3/uL (ref 0.00–0.07)
Basophils Absolute: 0.1 10*3/uL (ref 0.0–0.1)
Basophils Relative: 1 %
Eosinophils Absolute: 0.1 10*3/uL (ref 0.0–0.5)
Eosinophils Relative: 1 %
HCT: 35.2 % — ABNORMAL LOW (ref 36.0–46.0)
Hemoglobin: 11.2 g/dL — ABNORMAL LOW (ref 12.0–15.0)
Immature Granulocytes: 0 %
Lymphocytes Relative: 32 %
Lymphs Abs: 2.2 10*3/uL (ref 0.7–4.0)
MCH: 26.8 pg (ref 26.0–34.0)
MCHC: 31.8 g/dL (ref 30.0–36.0)
MCV: 84.2 fL (ref 80.0–100.0)
Monocytes Absolute: 0.7 10*3/uL (ref 0.1–1.0)
Monocytes Relative: 10 %
Neutro Abs: 3.8 10*3/uL (ref 1.7–7.7)
Neutrophils Relative %: 56 %
Platelets: 282 10*3/uL (ref 150–400)
RBC: 4.18 MIL/uL (ref 3.87–5.11)
RDW: 14.9 % (ref 11.5–15.5)
WBC: 6.9 10*3/uL (ref 4.0–10.5)
nRBC: 0 % (ref 0.0–0.2)

## 2021-07-11 LAB — HIV ANTIBODY (ROUTINE TESTING W REFLEX): HIV Screen 4th Generation wRfx: NONREACTIVE

## 2021-07-11 MED ORDER — MENTHOL 3 MG MT LOZG: 1.0000 | LOZENGE | OROMUCOSAL | Status: DC | PRN

## 2021-07-11 MED ORDER — MAGIC MOUTHWASH
15.0000 mL | Freq: Four times a day (QID) | ORAL | Status: DC | PRN
  Filled 2021-07-11: qty 15

## 2021-07-11 MED ORDER — PHENOL 1.4 % MT LIQD
2.0000 | OROMUCOSAL | Status: DC | PRN
Start: 2021-07-11 — End: 2021-07-12

## 2021-07-11 MED ORDER — ALUM & MAG HYDROXIDE-SIMETH 200-200-20 MG/5ML PO SUSP: 30.0000 mL | Freq: Four times a day (QID) | ORAL | Status: DC | PRN

## 2021-07-11 MED ORDER — SODIUM CHLORIDE 0.9% FLUSH: 3.0000 mL | INTRAVENOUS | Status: DC | PRN

## 2021-07-11 MED ORDER — TRAMADOL HCL 50 MG PO TABS: 50.0000 mg | ORAL_TABLET | Freq: Four times a day (QID) | ORAL | Status: DC | PRN

## 2021-07-11 MED ORDER — SODIUM CHLORIDE 0.9 % IV SOLN: 250.0000 mL | INTRAVENOUS | Status: DC | PRN

## 2021-07-11 MED ORDER — DIPHENHYDRAMINE HCL 50 MG/ML IJ SOLN: 12.5000 mg | Freq: Four times a day (QID) | INTRAMUSCULAR | Status: DC | PRN

## 2021-07-11 MED ORDER — PROCHLORPERAZINE MALEATE 5 MG PO TABS
5.0000 mg | ORAL_TABLET | Freq: Four times a day (QID) | ORAL | Status: DC | PRN
  Filled 2021-07-11: qty 2

## 2021-07-11 MED ORDER — LIP MEDEX EX OINT
1.0000 "application " | TOPICAL_OINTMENT | Freq: Two times a day (BID) | CUTANEOUS | Status: DC
  Administered 2021-07-11 – 2021-07-12 (×2): 1 via TOPICAL

## 2021-07-11 MED ORDER — ACETAMINOPHEN 325 MG PO TABS
650.0000 mg | ORAL_TABLET | Freq: Four times a day (QID) | ORAL | Status: DC | PRN
  Administered 2021-07-11 – 2021-07-12 (×3): 650 mg via ORAL
  Filled 2021-07-11 (×4): qty 2

## 2021-07-11 MED ORDER — PROCHLORPERAZINE EDISYLATE 10 MG/2ML IJ SOLN: 5.0000 mg | INTRAMUSCULAR | Status: DC | PRN

## 2021-07-11 MED ORDER — LACTATED RINGERS IV BOLUS: 1000.0000 mL | Freq: Three times a day (TID) | INTRAVENOUS | Status: DC | PRN

## 2021-07-11 MED ORDER — SODIUM CHLORIDE 0.9 % IV SOLN
8.0000 mg | Freq: Four times a day (QID) | INTRAVENOUS | Status: DC | PRN
  Filled 2021-07-11: qty 4

## 2021-07-11 MED ORDER — BISACODYL 10 MG RE SUPP: 10.0000 mg | Freq: Two times a day (BID) | RECTAL | Status: DC | PRN

## 2021-07-11 MED ORDER — SODIUM CHLORIDE 0.9% FLUSH
3.0000 mL | Freq: Two times a day (BID) | INTRAVENOUS | Status: DC
  Administered 2021-07-11 – 2021-07-12 (×2): 3 mL via INTRAVENOUS

## 2021-07-11 NOTE — Progress Notes (Signed)
Pt. refused PIV start at this time. RN aware.

## 2021-07-11 NOTE — Progress Notes (Signed)
Seen with Barnetta Chapel, PA  Patient denies nausea.  Was feeling a little queasy but now is hungry.  Tolerating liquids.  Denies abdominal pain or crampiness.  She does note that she has had intermittent episodes of crampy pain in the past.  She would like to try and advance diet and hold off on surgery.  In the absence of any hard contraindications, reasonable to try and advance diet.  If she has repeated episodes of pain or queasiness, she agrees to consider diagnostic laparoscopy to rule out partial obstruction/malrotation, other concerns.  She does not have peritonitis or shock that raises concerns of ischemic bowel, life-threatening volvulus, life-threatening internal hernia.  D/w pt & husband.  Ardeth Sportsman, MD, FACS, MASCRS Esophageal, Gastrointestinal & Colorectal Surgery Robotic and Minimally Invasive Surgery  [facility], [facility]  [facility]  [address] [place], [place] [address] [phone] Fax [phone] Main  CONTACT INFORMATION:  Weekday (9AM-5PM): Call [facility] main office at [phone]  Weeknight (5PM-9AM) or Weekend/Holiday: Check [email or web address] (password " [email or web address]") for General Surgery [facility] coverage  (Please, do not use SecureChat as it is not reliable communication to operating surgeons for immediate patient care)

## 2021-07-11 NOTE — Progress Notes (Signed)
Pt's PIV has infiltrated X 2 today, Pt has been stuck X 3 by IV team - pt currently refusing Ivs, states she will drink lots of liquids to make up the difference. Currently denies n/v and abd pain.

## 2021-07-11 NOTE — Progress Notes (Signed)
Progress Note     Subjective: Abdominal pain, nausea/emesis has resolved.  She has been n.p.o. but taking ice chips without problems.  She is passing flatus.  She has not had bowel movement since admission.  Objective: Vital signs in last 24 hours: Temp:  [97.7 F (36.5 C)-98.5 F (36.9 C)] 97.7 F (36.5 C) (08/15 0536) Pulse Rate:  [64-101] 72 (08/15 0536) Resp:  [16-18] 18 (08/15 0536) BP: (129-151)/(87-102) 137/100 (08/15 0536) SpO2:  [94 %-100 %] 99 % (08/15 0536) Weight:  [84.8 kg] 84.8 kg (08/14 1435) Last BM Date: 07/09/21  Intake/Output from previous day: 08/14 0701 - 08/15 0700 In: 1791.6 [I.V.:1791.6] Out: 0  Intake/Output this shift: No intake/output data recorded.  PE: General: pleasant, WD,  female who is laying in bed in NAD HEENT: head is normocephalic, atraumatic.  Mouth is pink and moist Heart: Palpable radial pulses bilaterally Lungs: Respiratory effort nonlabored Abd: soft, NT, ND, +BS, no masses, hernias, or organomegaly MSK: all 4 extremities are symmetrical with no cyanosis, clubbing, or edema. Skin: warm and dry with no masses, lesions, or rashes Psych: A&Ox3 with an appropriate affect.    Lab Results:  Recent Labs    07/10/21 0545 07/11/21 0334  WBC 10.6* 6.9  HGB 12.3 11.2*  HCT 39.1 35.2*  PLT 333 282   BMET Recent Labs    07/10/21 0545  NA 137  K 3.5  CL 101  CO2 23  GLUCOSE 184*  BUN 11  CREATININE 0.71  CALCIUM 9.4   PT/INR No results for input(s): LABPROT, INR in the last 72 hours. CMP     Component Value Date/Time   NA 137 07/10/2021 0545   K 3.5 07/10/2021 0545   CL 101 07/10/2021 0545   CO2 23 07/10/2021 0545   GLUCOSE 184 (H) 07/10/2021 0545   BUN 11 07/10/2021 0545   CREATININE 0.71 07/10/2021 0545   CALCIUM 9.4 07/10/2021 0545   PROT 8.3 (H) 07/10/2021 0545   ALBUMIN 4.3 07/10/2021 0545   AST 23 07/10/2021 0545   ALT 17 07/10/2021 0545   ALKPHOS 51 07/10/2021 0545   BILITOT 0.6 07/10/2021 0545    GFRNONAA >60 07/10/2021 0545   Lipase  No results found for: LIPASE     Studies/Results: CT ABDOMEN PELVIS W CONTRAST  Addendum Date: 07/10/2021   ADDENDUM REPORT: 07/10/2021 10:16 ADDENDUM: These results were called by telephone at the time of interpretation on 07/10/2021 at 10:13 am to provider [facility] , who verbally acknowledged these results. Electronically Signed   By: Romona Curls M.D.   On: 07/10/2021 10:16   Result Date: 07/10/2021 CLINICAL DATA:  Lower abdominal pain, history of gastric bypass surgery. Concern for abdominal abscess and bowel obstruction. EXAM: CT ABDOMEN AND PELVIS WITH CONTRAST TECHNIQUE: Multidetector CT imaging of the abdomen and pelvis was performed using the standard protocol following bolus administration of intravenous contrast. CONTRAST:  66mL OMNIPAQUE IOHEXOL 350 MG/ML SOLN COMPARISON:  None. FINDINGS: Lower chest: No acute abnormality. Hepatobiliary: No focal liver abnormality is seen. No gallstones, gallbladder wall thickening, or biliary dilatation. Pancreas: Unremarkable. No pancreatic ductal dilatation or surrounding inflammatory changes. Spleen: Normal in size without focal abnormality. Adrenals/Urinary Tract: Adrenal glands are unremarkable. Kidneys are normal, without renal calculi, focal lesion, or hydronephrosis. Bladder is unremarkable. Stomach/Bowel: The patient is status post gastric bypass surgery. No pericecal inflammatory changes are noted to suggest acute appendicitis. No evidence of bowel wall thickening, distention, or inflammatory changes. Vascular/Lymphatic: There is a swirling motion of  the superior mesenteric vein around the superior mesenteric artery with engorgement of multiple superior mesenteric vein branches and apparent narrowing of the vein closer to its origin (series 2 images 35-50). There appears to be mesenteric edema surrounding a portion of one of the superior mesenteric vein branches (likely the middle or right colic vein  as it rotates around the superior mesenteric artery and is located to the left of the superior mesenteric artery on series 2, images 38-42). No enlarged abdominal or pelvic lymph nodes are identified. Reproductive: A uterine fibroid is noted. Other: No abdominal wall hernia or abnormality. No abdominopelvic ascites. Musculoskeletal: No acute or significant osseous findings. IMPRESSION: Swirling motion of the superior mesenteric vein and its branches around the superior mesenteric artery with engorgement of multiple superior mesenteric vein branches and some associated mesenteric edema. 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 Electronically Signed: By: Romona Curls M.D. On: 07/10/2021 09:58   DG Abd Portable 1V-Small Bowel Obstruction Protocol-initial, 8 hr delay  Result Date: 07/10/2021 CLINICAL DATA:  Evaluate for small bowel obstruction. EXAM: PORTABLE ABDOMEN - 1 VIEW COMPARISON:  None. FINDINGS: The lung bases are normal. No free air, portal venous gas, or pneumatosis. Contrast is seen throughout the length of the nondilated small bowel. Contrast is seen in the colon to the level of the mid transverse colon. No other abnormalities. IMPRESSION: No obstruction seen on today's study. Electronically Signed   By: Gerome Sam III M.D.   On: 07/10/2021 18:24    Anti-infectives: Anti-infectives (From admission, onward)    None        Assessment/Plan Abdominal pain History of gastric bypass 2009 at [facility] of bowel/midgut volvulus? 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  She has been NPO.  Given resolution of symptoms we will trial clear liquids and advance as tolerated.  If she does well with p.o. intake then could discharge as early as this evening for follow-up with her surgeon at [facility].  If she has any recurrence of symptoms we discussed likely need for further evaluation in the OR which she is agreeable to at this time  FEN: Clears ADAT soft ID: None currently VTE: Heparin subq  Disposition: Possible discharge this p.m. if tolerating diet   LOS: 0 days    Eric Form, [facility] Surgery 07/11/2021, 10:46 AM Please see Amion for pager number during day hours 7:00am-4:30pm

## 2021-07-12 DIAGNOSIS — R1084 Generalized abdominal pain: Secondary | ICD-10-CM | POA: Diagnosis present

## 2021-07-12 NOTE — Progress Notes (Signed)
Progress Note     Subjective: No complaints this am - nausea/emesis, abdominal pain remain resolved and no further "queasiness." Passing flatus and BM yesterday. She has not had breakfast yet but tolerated dinner She does have a mild headache which is improved with tylenol  Objective: Vital signs in last 24 hours: Temp:  [98 F (36.7 C)-98.4 F (36.9 C)] 98.4 F (36.9 C) (08/15 2210) Pulse Rate:  [78-79] 79 (08/15 2210) Resp:  [16] 16 (08/15 1400) BP: (135-140)/(85-91) 140/91 (08/15 2210) SpO2:  [99 %-100 %] 100 % (08/15 2210) Last BM Date: 07/11/21  Intake/Output from previous day: 08/15 0701 - 08/16 0700 In: 3466.7 [P.O.:1690; I.V.:1776.7] Out: -  Intake/Output this shift: No intake/output data recorded.  PE: General: pleasant, WD,  female who is laying in bed in NAD HEENT: head is normocephalic, atraumatic.  Mouth is pink and moist Heart: Palpable radial pulses bilaterally Lungs: Respiratory effort nonlabored. CTAB Abd: soft, NT, ND, +BS, no masses, hernias, or organomegaly MSK: all 4 extremities are symmetrical with no cyanosis, clubbing, or edema. Skin: warm and dry with no masses, lesions, or rashes Psych: A&Ox3 with an appropriate affect.    Lab Results:  Recent Labs    07/10/21 0545 07/11/21 0334  WBC 10.6* 6.9  HGB 12.3 11.2*  HCT 39.1 35.2*  PLT 333 282    BMET Recent Labs    07/10/21 0545  NA 137  K 3.5  CL 101  CO2 23  GLUCOSE 184*  BUN 11  CREATININE 0.71  CALCIUM 9.4    PT/INR No results for input(s): LABPROT, INR in the last 72 hours. CMP     Component Value Date/Time   NA 137 07/10/2021 0545   K 3.5 07/10/2021 0545   CL 101 07/10/2021 0545   CO2 23 07/10/2021 0545   GLUCOSE 184 (H) 07/10/2021 0545   BUN 11 07/10/2021 0545   CREATININE 0.71 07/10/2021 0545   CALCIUM 9.4 07/10/2021 0545   PROT 8.3 (H) 07/10/2021 0545   ALBUMIN 4.3 07/10/2021 0545   AST 23 07/10/2021 0545   ALT 17 07/10/2021 0545   ALKPHOS 51 07/10/2021  0545   BILITOT 0.6 07/10/2021 0545   GFRNONAA >60 07/10/2021 0545   Lipase  No results found for: LIPASE     Studies/Results: CT ABDOMEN PELVIS W CONTRAST  Addendum Date: 07/10/2021   ADDENDUM REPORT: 07/10/2021 10:16 ADDENDUM: These results were called by telephone at the time of interpretation on 07/10/2021 at 10:13 am to provider [facility] , who verbally acknowledged these results. Electronically Signed   By: Romona Curls M.D.   On: 07/10/2021 10:16   Result Date: 07/10/2021 CLINICAL DATA:  Lower abdominal pain, history of gastric bypass surgery. Concern for abdominal abscess and bowel obstruction. EXAM: CT ABDOMEN AND PELVIS WITH CONTRAST TECHNIQUE: Multidetector CT imaging of the abdomen and pelvis was performed using the standard protocol following bolus administration of intravenous contrast. CONTRAST:  56mL OMNIPAQUE IOHEXOL 350 MG/ML SOLN COMPARISON:  None. FINDINGS: Lower chest: No acute abnormality. Hepatobiliary: No focal liver abnormality is seen. No gallstones, gallbladder wall thickening, or biliary dilatation. Pancreas: Unremarkable. No pancreatic ductal dilatation or surrounding inflammatory changes. Spleen: Normal in size without focal abnormality. Adrenals/Urinary Tract: Adrenal glands are unremarkable. Kidneys are normal, without renal calculi, focal lesion, or hydronephrosis. Bladder is unremarkable. Stomach/Bowel: The patient is status post gastric bypass surgery. No pericecal inflammatory changes are noted to suggest acute appendicitis. No evidence of bowel wall thickening, distention, or inflammatory changes. Vascular/Lymphatic: There  is a swirling motion of the superior mesenteric vein around the superior mesenteric artery with engorgement of multiple superior mesenteric vein branches and apparent narrowing of the vein closer to its origin (series 2 images 35-50). There appears to be mesenteric edema surrounding a portion of one of the superior mesenteric vein branches  (likely the middle or right colic vein as it rotates around the superior mesenteric artery and is located to the left of the superior mesenteric artery on series 2, images 38-42). No enlarged abdominal or pelvic lymph nodes are identified. Reproductive: A uterine fibroid is noted. Other: No abdominal wall hernia or abnormality. No abdominopelvic ascites. Musculoskeletal: No acute or significant osseous findings. IMPRESSION: Swirling motion of the superior mesenteric vein and its branches around the superior mesenteric artery with engorgement of multiple superior mesenteric vein branches and some associated mesenteric edema. 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 Electronically Signed: By: Romona Curls M.D. On: 07/10/2021 09:58   DG Abd Portable 1V-Small Bowel Obstruction Protocol-initial, 8 hr delay  Result Date: 07/10/2021 CLINICAL DATA:  Evaluate for small bowel obstruction. EXAM: PORTABLE ABDOMEN - 1 VIEW COMPARISON:  None. FINDINGS: The lung bases are normal. No free air, portal venous gas, or pneumatosis. Contrast is seen throughout the length of the nondilated small bowel. Contrast is seen in the colon to the level of the mid transverse colon. No other abnormalities. IMPRESSION: No obstruction seen on today's study. Electronically Signed   By: Gerome Sam III M.D.   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 Plan for bariatric diet breakfast and likely discharge today. If she has any recurrence of symptoms we discussed need for further evaluation in the OR which she is agreeable to at this time - she will need follow-up with her surgeon at [facility]  FEN: bariatric advanced ID: None currently VTE: Heparin subq  Disposition: discharge this p.m. if tolerating diet   LOS: 0 days    Eric Form, [facility] 07/12/2021, 8:47 AM Please see Amion for pager number during day hours 7:00am-4:30pm

## 2021-07-12 NOTE — Discharge Summary (Signed)
[facility] Discharge Summary   Patient ID: Lauren Wallace MRN: [id number] DOB/AGE: May 17, 1976 45 y.o.  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  Discharge Diagnosis Abdominal pain - resolved History of gastric bypass 2009 at [facility] None  Imaging: DG Abd Portable 1V-Small Bowel Obstruction Protocol-initial, 8 hr delay  Result Date: 07/10/2021 CLINICAL DATA:  Evaluate for small bowel obstruction. EXAM: PORTABLE ABDOMEN - 1 VIEW COMPARISON:  None. FINDINGS: The lung bases are normal. No free air, portal venous gas, or pneumatosis. Contrast is seen throughout the length of the nondilated small bowel. Contrast is seen in the colon to the level of the mid transverse colon. No other abnormalities. IMPRESSION: No obstruction seen on today's study. Electronically Signed   By: Gerome Sam III M.D.   On: 07/10/2021 18:24    Procedures none  Hospital Course:  Lauren Wallace is a 45 y.o. female who underwent a gastric bypass in 2009 by Dr. Adele Dan at [facility].  She has had 1 other laparoscopy by Dr. Lorie Phenix.  She presented with a 2-day history of some lower abdominal pain.  This began day prior to presentation as she was getting ready to go out of town.  [place] had [place] toast and got sick.  She had a bowel movement and was nauseated but was unable to vomit.  She did not feel well and was brought to the emergency department. A CT scan was obtained which showed some possible swirling of the bowel and the question of a midgut volvulus was raised.  The CT was reviewed viewed by [facility] surgeons Dr. Corliss Skains and Dr. Daphine Deutscher.  Patient was seen by Dr. Daphine Deutscher in the ED and she appeared comfortable.  Her abdomen was flat and not distended without peritoneal signs. OR evaluation was offered vs admission and monitoring and she opted for the latter.  She was admitted to the [facility] service for further evaluation and management. Follow up abdominal xray with oral contrast showed contrast in the transverse colon and negative for obstruction. Diet was slowly advanced from NPO which she tolerated well without any further nausea/emesis or abdominal pain.  On date of discharge she was tolerating a solid diet and having good bowel function - passing flatus and bowel movements. Laparoscopic evaluation in the OR was again offered to patient on date of discharge however due to resolution of her symptoms she opted to discharge home with strict return precautions which I think is reasonable. She expressed understanding of return precautions and will follow up with her [facility] bariatric surgery office. I have also provided our office information in her discharge paperwork should she have any questions or concerns.   Allergies as of 07/12/2021       Reactions   Labetalol Swelling   Lip swelling   Penicillins Hives        Medication List     TAKE these medications    acetaminophen 500 MG tablet Commonly known as: TYLENOL Take 1,000 mg by mouth every 6 (six) hours as needed for mild pain or headache.   azelastine 0.1 % nasal spray Commonly known as: ASTELIN Place 1 spray into both nostrils 2 (two) times daily as needed for rhinitis or allergies.   fexofenadine 180 MG tablet Commonly known as: ALLEGRA Take 180 mg by mouth daily as needed for allergies.   hydrochlorothiazide 12.5 MG tablet Commonly known  as: HYDRODIURIL Take 12.5 mg by mouth daily.   Multi-Vitamin tablet Take 1 tablet by mouth daily.   phentermine 37.5 MG tablet Commonly known as: ADIPEX-P Take 37.5 mg by mouth daily. Takes for 42 days, then stops for 28 days, then restart cycle.   Semaglutide-Weight Management 2.4 MG/0.75ML Soaj Inject 2.4 mg into the skin once a week. Wednesday's   vitamin B-12 1000 MCG tablet Commonly known as: CYANOCOBALAMIN Take 1,000 mcg by mouth  daily.   Vitamin D3 25 MCG (1000 UT) Caps Take 1,000 Units by mouth daily.          Follow-up Information     Luretha Murphy, MD Follow up.   Specialty: General Surgery Why: As needed, If symptoms worsen Contact information: [address] [place] [address] [phone]                 Signed: Eric Form , [facility] 07/12/2021, 3:16 PM Please see Amion for pager number during day hours 7:00am-4:30pm

## 2021-07-12 NOTE — Plan of Care (Signed)
Instructions were reviewed with patient. All questions were answered. Patient was transported to main entrance by wheelchair. ° °

## 2022-05-31 ENCOUNTER — Encounter: Payer: Self-pay | Admitting: Oncology

## 2022-05-31 ENCOUNTER — Inpatient Hospital Stay: Payer: BC Managed Care – PPO | Attending: Oncology | Admitting: Oncology

## 2022-05-31 ENCOUNTER — Inpatient Hospital Stay: Payer: BC Managed Care – PPO

## 2022-05-31 DIAGNOSIS — I10 Essential (primary) hypertension: Secondary | ICD-10-CM | POA: Insufficient documentation

## 2022-05-31 DIAGNOSIS — D509 Iron deficiency anemia, unspecified: Secondary | ICD-10-CM | POA: Diagnosis not present

## 2022-05-31 DIAGNOSIS — Z79899 Other long term (current) drug therapy: Secondary | ICD-10-CM | POA: Diagnosis not present

## 2022-05-31 DIAGNOSIS — D508 Other iron deficiency anemias: Secondary | ICD-10-CM

## 2022-05-31 DIAGNOSIS — Z9884 Bariatric surgery status: Secondary | ICD-10-CM | POA: Insufficient documentation

## 2022-05-31 LAB — COMPREHENSIVE METABOLIC PANEL
ALT: 15 U/L (ref 0–44)
AST: 25 U/L (ref 15–41)
Albumin: 3.8 g/dL (ref 3.5–5.0)
Alkaline Phosphatase: 46 U/L (ref 38–126)
Anion gap: 7 (ref 5–15)
BUN: 15 mg/dL (ref 6–20)
CO2: 26 mmol/L (ref 22–32)
Calcium: 9 mg/dL (ref 8.9–10.3)
Chloride: 102 mmol/L (ref 98–111)
Creatinine, Ser: 0.72 mg/dL (ref 0.44–1.00)
GFR, Estimated: >60 mL/min (ref >60)
Glucose, Bld: 89 mg/dL (ref 70–99)
Potassium: 3.5 mmol/L (ref 3.5–5.1)
Sodium: 135 mmol/L (ref 135–145)
Total Bilirubin: 0.5 mg/dL (ref 0.3–1.2)
Total Protein: 7.9 g/dL (ref 6.5–8.1)

## 2022-05-31 LAB — CBC WITH DIFFERENTIAL/PLATELET
Abs Immature Granulocytes: 0.04 10*3/uL (ref 0.00–0.07)
Basophils Absolute: 0.1 10*3/uL (ref 0.0–0.1)
Basophils Relative: 1 %
Eosinophils Absolute: 0.1 10*3/uL (ref 0.0–0.5)
Eosinophils Relative: 1 %
HCT: 40 % (ref 36.0–46.0)
Hemoglobin: 12.6 g/dL (ref 12.0–15.0)
Immature Granulocytes: 1 %
Lymphocytes Relative: 33 %
Lymphs Abs: 1.9 10*3/uL (ref 0.7–4.0)
MCH: 26.1 pg (ref 26.0–34.0)
MCHC: 31.5 g/dL (ref 30.0–36.0)
MCV: 82.8 fL (ref 80.0–100.0)
Monocytes Absolute: 0.7 10*3/uL (ref 0.1–1.0)
Monocytes Relative: 12 %
Neutro Abs: 3.1 10*3/uL (ref 1.7–7.7)
Neutrophils Relative %: 52 %
Platelets: 235 10*3/uL (ref 150–400)
RBC: 4.83 MIL/uL (ref 3.87–5.11)
RDW: 16.3 % — ABNORMAL HIGH (ref 11.5–15.5)
WBC: 5.9 10*3/uL (ref 4.0–10.5)
nRBC: 0 % (ref 0.0–0.2)

## 2022-05-31 LAB — TECHNOLOGIST SMEAR REVIEW
Plt Morphology: NORMAL
RBC MORPHOLOGY: NORMAL
WBC MORPHOLOGY: NORMAL

## 2022-05-31 LAB — RETIC PANEL
Immature Retic Fract: 3.6 % (ref 2.3–15.9)
RBC.: 4.82 MIL/uL (ref 3.87–5.11)
Retic Count, Absolute: 35.2 10*3/uL (ref 19.0–186.0)
Retic Ct Pct: 0.7 % (ref 0.4–3.1)
Reticulocyte Hemoglobin: 30.6 pg (ref >27.9)

## 2022-05-31 LAB — VITAMIN B12: Vitamin B-12: 341 pg/mL (ref 180–914)

## 2022-05-31 LAB — FOLATE: Folate: 12.6 ng/mL (ref >5.9)

## 2022-05-31 LAB — FERRITIN: Ferritin: 8 ng/mL — ABNORMAL LOW (ref 11–307)

## 2022-05-31 MED ORDER — FERROUS SULFATE 325 (65 FE) MG PO TBEC: 325.0000 mg | DELAYED_RELEASE_TABLET | Freq: Two times a day (BID) | ORAL | 3 refills | Status: AC

## 2022-05-31 NOTE — Progress Notes (Signed)
Hematology/Oncology Consult note [phone] [phone]      Patient Care Team: Myrene Buddy, NP as PCP - General (Internal Medicine) Rickard Patience, MD as Consulting Physician (Oncology)   REFERRING PROVIDER: Mauri Brooklyn*  CHIEF COMPLAINTS/REASON FOR VISIT:  Anemia  ASSESSMENT & PLAN:  Gastric bypass status for obesity at [facility] 2009 At the risk of developing vitamin deficiency check vitamin B12, folate, iron TIBC ferritin.  IDA (iron deficiency anemia) Check CBC, iron TIBC ferritin. Patient has been on oral iron supplementation Labs reviewed in detail mastication. She has normal hemoglobin, decreased ferritin level at 8. Given that she has no anemia, I recommend patient to take 3 months of iron supplementation and repeat blood work.  If persistently low, consider IV Venofer treatments.  We discussed about rationale and potential side effects of IV iron treatments and she agrees with the plan if she needs IV iron treatment in future.  Orders Placed This Encounter  Procedures   Ferritin    Standing Status:   Future    Number of Occurrences:   1    Standing Expiration Date:   12/01/2022   Technologist smear review    Standing Status:   Future    Number of Occurrences:   1    Standing Expiration Date:   06/01/2023   CBC with Differential/Platelet    Standing Status:   Future    Number of Occurrences:   1    Standing Expiration Date:   06/01/2023   Folate    Standing Status:   Future    Number of Occurrences:   1    Standing Expiration Date:   06/01/2023   Vitamin B12    Standing Status:   Future    Number of Occurrences:   1    Standing Expiration Date:   06/01/2023   Retic Panel    Standing Status:   Future    Number of Occurrences:   1    Standing Expiration Date:   06/01/2023   Comprehensive metabolic panel    Standing Status:   Future    Number of Occurrences:   1    Standing Expiration Date:   06/01/2023   Follow-up in 3  months. All questions were answered. The patient knows to call the clinic with any problems, questions or concerns.  Rickard Patience, MD, PhD [facility] Hematology Oncology 05/31/2022     HISTORY OF PRESENTING ILLNESS:  Lauren Wallace is a  46 y.o.  female with PMH listed below who was referred to me for anemia Reviewed patient's recent labs that was done.  She was found to have abnormal CBC on 03/01/2022.  She had a hemoglobin of 11.9, iron panel showed ferritin 6, iron saturation 11. Patient has taken oral iron supplementation once daily.  She has a history of gastric bypass. She reports feeling no difference after taking iron supplementation.  Her menses are not heavy.  She denies recent chest pain on exertion, shortness of breath on minimal exertion, pre-syncopal episodes, or palpitations She had not noticed any recent bleeding such as epistaxis, hematuria or hematochezia.  She denies any pica and eats a variety of diet.    MEDICAL HISTORY:  Past Medical History:  Diagnosis Date   Hypertension     SURGICAL HISTORY: Past Surgical History:  Procedure Laterality Date   GASTRIC BYPASS  2009   HERNIA REPAIR      SOCIAL HISTORY: Social History   Socioeconomic History   Marital status:  Married    Spouse name: Not on file   Number of children: Not on file   Years of education: Not on file   Highest education level: Not on file  Occupational History   Not on file  Tobacco Use   Smoking status: Never   Smokeless tobacco: Never  Vaping Use   Vaping Use: Never used  Substance and Sexual Activity   Alcohol use: Never   Drug use: Never   Sexual activity: Not on file  Other Topics Concern   Not on file  Social History Narrative   Not on file   Social Determinants of Health   Financial Resource Strain: Not on file  Food Insecurity: Not on file  Transportation Needs: Not on file  Physical Activity: Not on file  Stress: Not on file  Social Connections: Not on file  Intimate  Partner Violence: Not on file    FAMILY HISTORY: Family History  Problem Relation Age of Onset   Esophageal cancer Father    Hypertension Father    Diabetes Father     ALLERGIES:  is allergic to labetalol and penicillins.  MEDICATIONS:  Current Outpatient Medications  Medication Sig Dispense Refill   acetaminophen (TYLENOL) 500 MG tablet Take 1,000 mg by mouth every 6 (six) hours as needed for mild pain or headache.     azelastine (ASTELIN) 0.1 % nasal spray Place 1 spray into both nostrils 2 (two) times daily as needed for rhinitis or allergies.     Cholecalciferol (VITAMIN D3) 25 MCG (1000 UT) CAPS Take 1,000 Units by mouth daily.     fexofenadine (ALLEGRA) 180 MG tablet Take 180 mg by mouth daily as needed for allergies.     hydrochlorothiazide (HYDRODIURIL) 12.5 MG tablet Take 12.5 mg by mouth daily.     Multiple Vitamin (MULTI-VITAMIN) tablet Take 1 tablet by mouth daily.     phentermine (ADIPEX-P) 37.5 MG tablet Take 37.5 mg by mouth daily. Takes for 42 days, then stops for 28 days, then restart cycle.     Semaglutide-Weight Management 2.4 MG/0.75ML SOAJ Inject 2.4 mg into the skin once a week. Wednesday's     vitamin B-12 (CYANOCOBALAMIN) 1000 MCG tablet Take 1,000 mcg by mouth daily.     No current facility-administered medications for this visit.    Review of Systems  Constitutional:  Positive for fatigue. Negative for appetite change, chills and fever.  HENT:   Negative for hearing loss and voice change.   Eyes:  Negative for eye problems.  Respiratory:  Negative for chest tightness and cough.   Cardiovascular:  Negative for chest pain.  Gastrointestinal:  Negative for abdominal distention, abdominal pain and blood in stool.  Endocrine: Negative for hot flashes.  Genitourinary:  Negative for difficulty urinating and frequency.   Musculoskeletal:  Negative for arthralgias.  Skin:  Negative for itching and rash.  Neurological:  Negative for extremity weakness.   Hematological:  Negative for adenopathy.  Psychiatric/Behavioral:  Negative for confusion.     PHYSICAL EXAMINATION: ECOG PERFORMANCE STATUS: 1 - Symptomatic but completely ambulatory Vitals:   05/31/22 1512  BP: 130/85  Pulse: 75  Resp: 18  Temp: (!) 97.4 F (36.3 C)   Filed Weights   05/31/22 1512  Weight: 191 lb 11.2 oz (87 kg)    Physical Exam Constitutional:      General: She is not in acute distress. HENT:     Head: Normocephalic and atraumatic.  Eyes:     General: No scleral icterus.  Cardiovascular:     Rate and Rhythm: Normal rate and regular rhythm.     Heart sounds: Normal heart sounds.  Pulmonary:     Effort: Pulmonary effort is normal. No respiratory distress.     Breath sounds: No wheezing.  Abdominal:     General: Bowel sounds are normal. There is no distension.     Palpations: Abdomen is soft.  Musculoskeletal:        General: No deformity. Normal range of motion.     Cervical back: Normal range of motion and neck supple.  Skin:    General: Skin is warm and dry.     Findings: No erythema or rash.  Neurological:     Mental Status: She is alert and oriented to person, place, and time. Mental status is at baseline.     Cranial Nerves: No cranial nerve deficit.     Coordination: Coordination normal.  Psychiatric:        Mood and Affect: Mood normal.      LABORATORY DATA:  I have reviewed the data as listed Lab Results  Component Value Date   WBC 5.9 05/31/2022   HGB 12.6 05/31/2022   HCT 40.0 05/31/2022   MCV 82.8 05/31/2022   PLT 235 05/31/2022   Lab Results  Component Value Date   NA 135 05/31/2022   K 3.5 05/31/2022   CL 102 05/31/2022   CO2 26 05/31/2022      Component Value Date/Time   FERRITIN 8 (L) 05/31/2022 1538     RADIOGRAPHIC STUDIES: I have personally reviewed the radiological images as listed and agreed with the findings in the report. No results found.

## 2022-05-31 NOTE — Assessment & Plan Note (Signed)
At the risk of developing vitamin deficiency check vitamin B12, folate, iron TIBC ferritin.

## 2022-05-31 NOTE — Assessment & Plan Note (Signed)
Check CBC, iron TIBC ferritin. Patient has been on oral iron supplementation Labs reviewed in detail mastication. She has normal hemoglobin, decreased ferritin level at 8. Given that she has no anemia, I recommend patient to take 3 months of iron supplementation and repeat blood work.  If persistently low, consider IV Venofer treatments.  We discussed about rationale and potential side effects of IV iron treatments and she agrees with the plan if she needs IV iron treatment in future.

## 2022-05-31 NOTE — Addendum Note (Signed)
Addended by: Rickard Patience on: 05/31/2022 10:41 PM   Modules accepted: Orders

## 2022-06-01 ENCOUNTER — Telehealth: Payer: Self-pay

## 2022-06-01 NOTE — Telephone Encounter (Signed)
-----   Message from Rickard Patience, MD sent at 05/31/2022 10:40 PM EDT ----- Let patient know that her hemoglobin level is normal.  Ferritin level is normal.  Consistent with iron deficiency without anemia.  Recommend patient to continue oral iron supplementation, ferrous sulfate 325 mg twice daily.  Prescription sent to pharmacy.  If iron is persistent low after 3 months of oral iron supplementation, recommend iron infusion at that time.   Please arrange her to follow-up in 3 months, labs prior to MD +/- venofer-Labs ordered.

## 2022-06-01 NOTE — Telephone Encounter (Signed)
Called and informed patient of lab results and Dr. Yu's recommendation. Patient verbalized understanding.  

## 2022-08-25 ENCOUNTER — Encounter: Payer: Self-pay | Admitting: Oncology

## 2022-09-01 ENCOUNTER — Other Ambulatory Visit: Payer: BC Managed Care – PPO

## 2022-09-04 ENCOUNTER — Ambulatory Visit: Payer: BC Managed Care – PPO | Admitting: Oncology

## 2022-09-04 ENCOUNTER — Ambulatory Visit: Payer: BC Managed Care – PPO

## 2022-09-22 ENCOUNTER — Other Ambulatory Visit: Payer: Self-pay

## 2022-09-26 ENCOUNTER — Ambulatory Visit: Payer: Self-pay | Admitting: Oncology

## 2022-09-26 ENCOUNTER — Ambulatory Visit: Payer: Self-pay

## 2022-10-30 MED FILL — Iron Sucrose Inj 20 MG/ML (Fe Equiv): INTRAVENOUS | Qty: 10 | Status: AC

## 2022-10-31 ENCOUNTER — Encounter: Payer: Self-pay | Admitting: Oncology

## 2022-10-31 ENCOUNTER — Inpatient Hospital Stay: Payer: BC Managed Care – PPO | Attending: Oncology

## 2022-10-31 ENCOUNTER — Inpatient Hospital Stay: Payer: BC Managed Care – PPO

## 2022-10-31 ENCOUNTER — Inpatient Hospital Stay (HOSPITAL_BASED_OUTPATIENT_CLINIC_OR_DEPARTMENT_OTHER): Payer: BC Managed Care – PPO | Admitting: Oncology

## 2022-10-31 VITALS — BP 152/100 | HR 79 | Temp 97.7°F | Wt 197.4 lb

## 2022-10-31 DIAGNOSIS — Z9884 Bariatric surgery status: Secondary | ICD-10-CM

## 2022-10-31 DIAGNOSIS — D509 Iron deficiency anemia, unspecified: Secondary | ICD-10-CM | POA: Diagnosis present

## 2022-10-31 DIAGNOSIS — D508 Other iron deficiency anemias: Secondary | ICD-10-CM | POA: Diagnosis not present

## 2022-10-31 LAB — CBC WITH DIFFERENTIAL/PLATELET
Abs Immature Granulocytes: 0.03 10*3/uL (ref 0.00–0.07)
Basophils Absolute: 0 10*3/uL (ref 0.0–0.1)
Basophils Relative: 0 %
Eosinophils Absolute: 0.1 10*3/uL (ref 0.0–0.5)
Eosinophils Relative: 1 %
HCT: 41.5 % (ref 36.0–46.0)
Hemoglobin: 13.5 g/dL (ref 12.0–15.0)
Immature Granulocytes: 0 %
Lymphocytes Relative: 25 %
Lymphs Abs: 1.8 10*3/uL (ref 0.7–4.0)
MCH: 27.6 pg (ref 26.0–34.0)
MCHC: 32.5 g/dL (ref 30.0–36.0)
MCV: 84.9 fL (ref 80.0–100.0)
Monocytes Absolute: 0.7 10*3/uL (ref 0.1–1.0)
Monocytes Relative: 9 %
Neutro Abs: 4.5 10*3/uL (ref 1.7–7.7)
Neutrophils Relative %: 65 %
Platelets: 251 10*3/uL (ref 150–400)
RBC: 4.89 MIL/uL (ref 3.87–5.11)
RDW: 13.7 % (ref 11.5–15.5)
WBC: 7 10*3/uL (ref 4.0–10.5)
nRBC: 0 % (ref 0.0–0.2)

## 2022-10-31 LAB — IRON AND TIBC
Iron: 179 ug/dL — ABNORMAL HIGH (ref 28–170)
Saturation Ratios: 42 % — ABNORMAL HIGH (ref 10.4–31.8)
TIBC: 430 ug/dL (ref 250–450)
UIBC: 251 ug/dL

## 2022-10-31 LAB — RETIC PANEL
Immature Retic Fract: 4.3 % (ref 2.3–15.9)
RBC.: 4.84 MIL/uL (ref 3.87–5.11)
Retic Count, Absolute: 34.4 10*3/uL (ref 19.0–186.0)
Retic Ct Pct: 0.7 % (ref 0.4–3.1)
Reticulocyte Hemoglobin: 31.9 pg (ref >27.9)

## 2022-10-31 LAB — FERRITIN: Ferritin: 16 ng/mL (ref 11–307)

## 2022-11-01 ENCOUNTER — Encounter: Payer: Self-pay | Admitting: Oncology

## 2022-11-01 NOTE — Assessment & Plan Note (Signed)
At the risk of developing vitamin deficiency Monitor vitamin B12, folate, iron TIBC ferritin.  Periodically

## 2022-11-01 NOTE — Progress Notes (Signed)
Hematology/Oncology Consult note [phone] [phone]      Patient Care Team: Myrene Buddy, NP as PCP - General (Internal Medicine) Rickard Patience, MD as Consulting Physician (Oncology)   REFERRING PROVIDER: Myrene Buddy, *  CHIEF COMPLAINTS/REASON FOR VISIT:  Anemia  ASSESSMENT & PLAN:  IDA (iron deficiency anemia) Check CBC, iron TIBC ferritin. Labs reviewed in detail  Hemoglobin is normal.  No need for IV Venofer treatments currently. Iron panel labs.  Available after patient's visit. Continue oral iron supplementation, decrease to daily.  I sent MyChart message to patient.  Gastric bypass status for obesity at [facility] 2009 At the risk of developing vitamin deficiency Monitor vitamin B12, folate, iron TIBC ferritin.  Periodically  Orders Placed This Encounter  Procedures   CBC with Differential/Platelet    Standing Status:   Future    Standing Expiration Date:   10/31/2023   Iron and TIBC([facility])    Standing Status:   Future    Standing Expiration Date:   11/01/2023   Ferritin    Standing Status:   Future    Standing Expiration Date:   11/01/2023   Follow-up in 6 months. All questions were answered. The patient knows to call the clinic with any problems, questions or concerns.  Rickard Patience, MD, PhD [facility] Hematology Oncology 10/31/2022     HISTORY OF PRESENTING ILLNESS:  Lauren Wallace is a  46 y.o.  female with PMH listed below who was referred to me for anemia Reviewed patient's recent labs that was done.  She was found to have abnormal CBC on 03/01/2022.  She had a hemoglobin of 11.9, iron panel showed ferritin 6, iron saturation 11. Patient has taken oral iron supplementation once daily.  She has a history of gastric bypass. She reports feeling no difference after taking iron supplementation.  Her menses are not heavy.  She denies recent chest pain on exertion, shortness of breath on minimal exertion, pre-syncopal  episodes, or palpitations She had not noticed any recent bleeding such as epistaxis, hematuria or hematochezia.  She denies any pica and eats a variety of diet.    MEDICAL HISTORY:  Past Medical History:  Diagnosis Date   Hypertension     SURGICAL HISTORY: Past Surgical History:  Procedure Laterality Date   GASTRIC BYPASS  2009   HERNIA REPAIR      SOCIAL HISTORY: Social History   Socioeconomic History   Marital status: Married    Spouse name: Not on file   Number of children: Not on file   Years of education: Not on file   Highest education level: Not on file  Occupational History   Not on file  Tobacco Use   Smoking status: Never   Smokeless tobacco: Never  Vaping Use   Vaping Use: Never used  Substance and Sexual Activity   Alcohol use: Never   Drug use: Never   Sexual activity: Not on file  Other Topics Concern   Not on file  Social History Narrative   Not on file   Social Determinants of Health   Financial Resource Strain: Not on file  Food Insecurity: Not on file  Transportation Needs: Not on file  Physical Activity: Not on file  Stress: Not on file  Social Connections: Not on file  Intimate Partner Violence: Not on file    FAMILY HISTORY: Family History  Problem Relation Age of Onset   Esophageal cancer Father    Hypertension Father    Diabetes Father  ALLERGIES:  is allergic to labetalol and penicillins.  MEDICATIONS:  Current Outpatient Medications  Medication Sig Dispense Refill   acetaminophen (TYLENOL) 500 MG tablet Take 1,000 mg by mouth every 6 (six) hours as needed for mild pain or headache.     azelastine (ASTELIN) 0.1 % nasal spray Place 1 spray into both nostrils 2 (two) times daily as needed for rhinitis or allergies.     Cholecalciferol (VITAMIN D3) 25 MCG (1000 UT) CAPS Take 1,000 Units by mouth daily.     ferrous sulfate 325 (65 FE) MG EC tablet Take 1 tablet (325 mg total) by mouth 2 (two) times daily with a meal. 60  tablet 3   fexofenadine (ALLEGRA) 180 MG tablet Take 180 mg by mouth daily as needed for allergies.     hydrochlorothiazide (HYDRODIURIL) 12.5 MG tablet Take 12.5 mg by mouth daily.     Multiple Vitamin (MULTI-VITAMIN) tablet Take 1 tablet by mouth daily.     phentermine (ADIPEX-P) 37.5 MG tablet Take 37.5 mg by mouth daily. Takes for 42 days, then stops for 28 days, then restart cycle.     Semaglutide-Weight Management 2.4 MG/0.75ML SOAJ Inject 2.4 mg into the skin once a week. Wednesday's     vitamin B-12 (CYANOCOBALAMIN) 1000 MCG tablet Take 1,000 mcg by mouth daily.     No current facility-administered medications for this visit.    Review of Systems  Constitutional:  Positive for fatigue. Negative for appetite change, chills and fever.  HENT:   Negative for hearing loss and voice change.   Eyes:  Negative for eye problems.  Respiratory:  Negative for chest tightness and cough.   Cardiovascular:  Negative for chest pain.  Gastrointestinal:  Negative for abdominal distention, abdominal pain and blood in stool.  Endocrine: Negative for hot flashes.  Genitourinary:  Negative for difficulty urinating and frequency.   Musculoskeletal:  Negative for arthralgias.  Skin:  Negative for itching and rash.  Neurological:  Negative for extremity weakness.  Hematological:  Negative for adenopathy.  Psychiatric/Behavioral:  Negative for confusion.     PHYSICAL EXAMINATION: ECOG PERFORMANCE STATUS: 1 - Symptomatic but completely ambulatory Vitals:   10/31/22 1346  BP: (!) 152/100  Pulse: 79  Temp: 97.7 F (36.5 C)  SpO2: 100%   Filed Weights   10/31/22 1346  Weight: 197 lb 6.4 oz (89.5 kg)    Physical Exam Constitutional:      General: She is not in acute distress. HENT:     Head: Normocephalic and atraumatic.  Eyes:     General: No scleral icterus. Cardiovascular:     Rate and Rhythm: Normal rate and regular rhythm.     Heart sounds: Normal heart sounds.  Pulmonary:      Effort: Pulmonary effort is normal. No respiratory distress.     Breath sounds: No wheezing.  Abdominal:     General: Bowel sounds are normal. There is no distension.     Palpations: Abdomen is soft.  Musculoskeletal:        General: No deformity. Normal range of motion.     Cervical back: Normal range of motion and neck supple.  Skin:    General: Skin is warm and dry.     Findings: No erythema or rash.  Neurological:     Mental Status: She is alert and oriented to person, place, and time. Mental status is at baseline.     Cranial Nerves: No cranial nerve deficit.     Coordination: Coordination normal.  Psychiatric:        Mood and Affect: Mood normal.      LABORATORY DATA:  I have reviewed the data as listed Lab Results  Component Value Date   WBC 7.0 10/31/2022   HGB 13.5 10/31/2022   HCT 41.5 10/31/2022   MCV 84.9 10/31/2022   PLT 251 10/31/2022   Lab Results  Component Value Date   NA 135 05/31/2022   K 3.5 05/31/2022   CL 102 05/31/2022   CO2 26 05/31/2022      Component Value Date/Time   IRON 179 (H) 10/31/2022 1337   TIBC 430 10/31/2022 1337   FERRITIN 16 10/31/2022 1337   IRONPCTSAT 42 (H) 10/31/2022 1337     RADIOGRAPHIC STUDIES: I have personally reviewed the radiological images as listed and agreed with the findings in the report. No results found.

## 2022-11-01 NOTE — Assessment & Plan Note (Addendum)
Check CBC, iron TIBC ferritin. Labs reviewed in detail  Hemoglobin is normal.  No need for IV Venofer treatments currently. Iron panel labs.  Available after patient's visit. Continue oral iron supplementation, decrease to daily.  I sent MyChart message to patient.

## 2022-12-20 ENCOUNTER — Encounter: Payer: Self-pay | Admitting: Oncology

## 2023-03-15 IMAGING — CR DG HIP (WITH OR WITHOUT PELVIS) 2-3V*L*
3 series · 3 of 3 positions shown · non-contrast
Comparison: None.

CLINICAL DATA: Recent trip and fall with left hip pain, initial
encounter

EXAM:
DG HIP (WITH OR WITHOUT PELVIS) 3V LEFT

[pelvis ap]
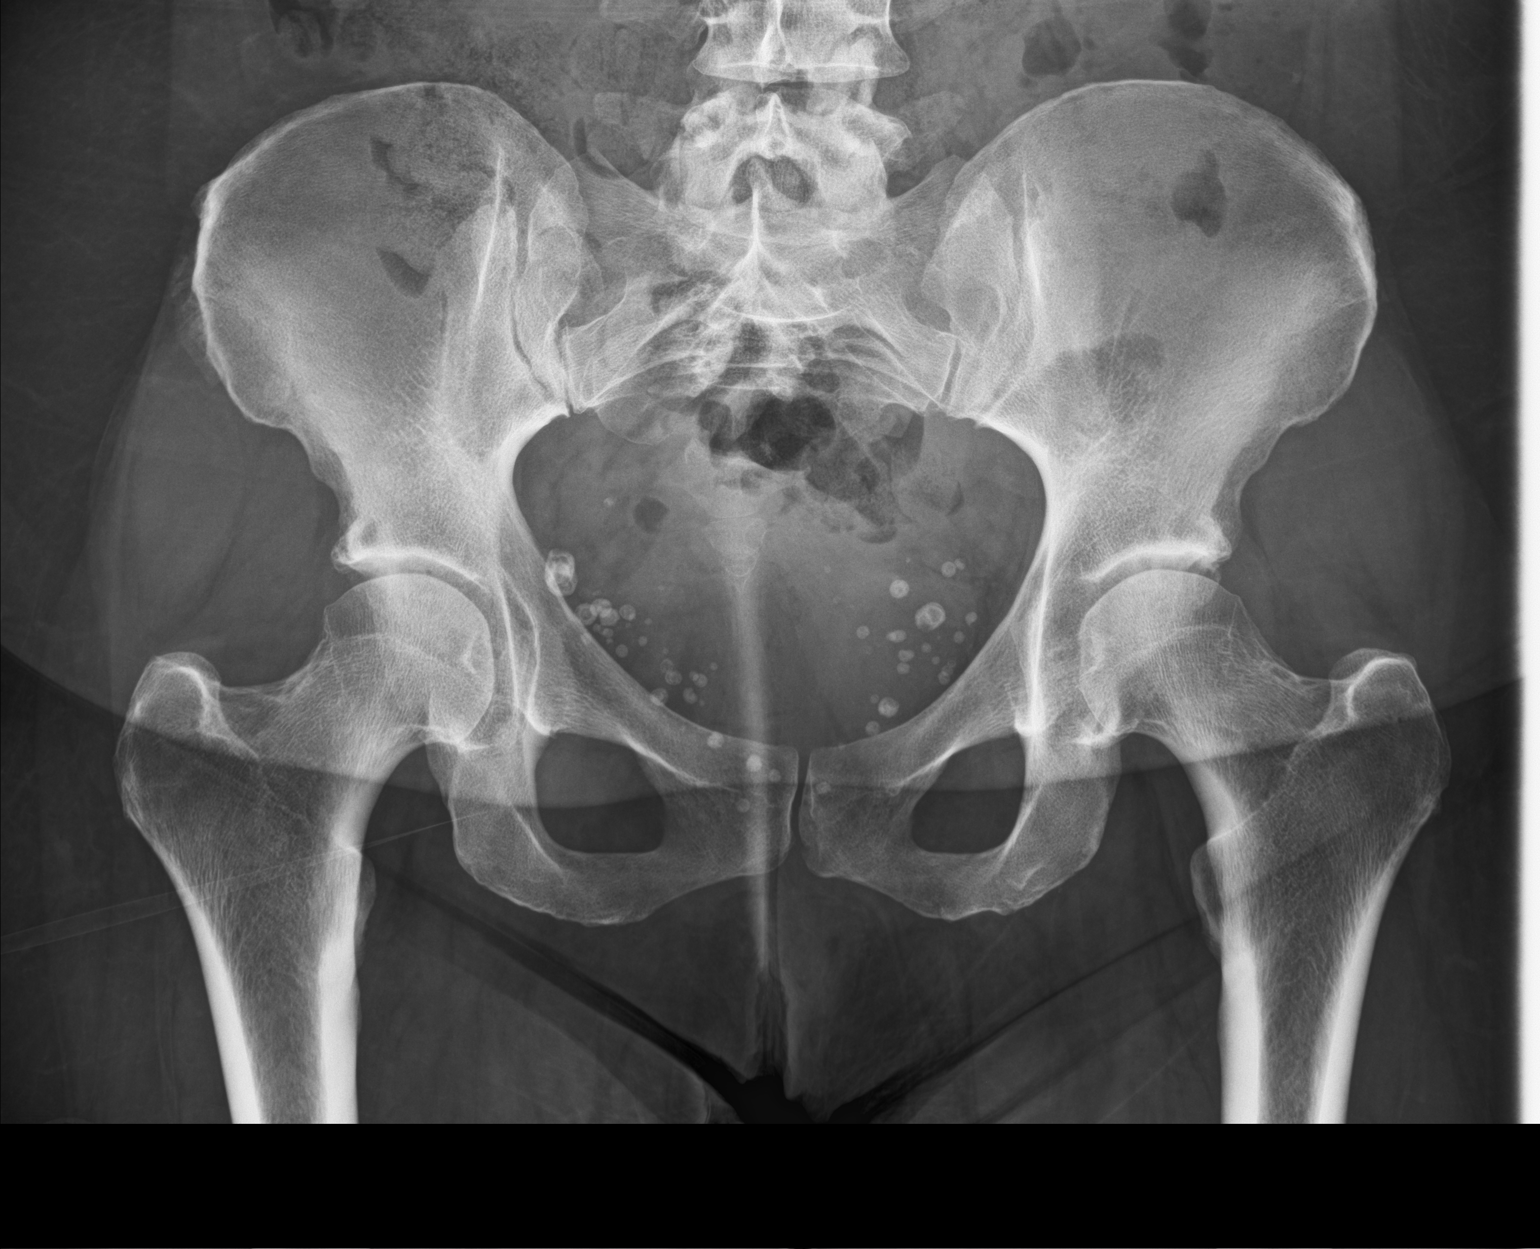

[hip ap]
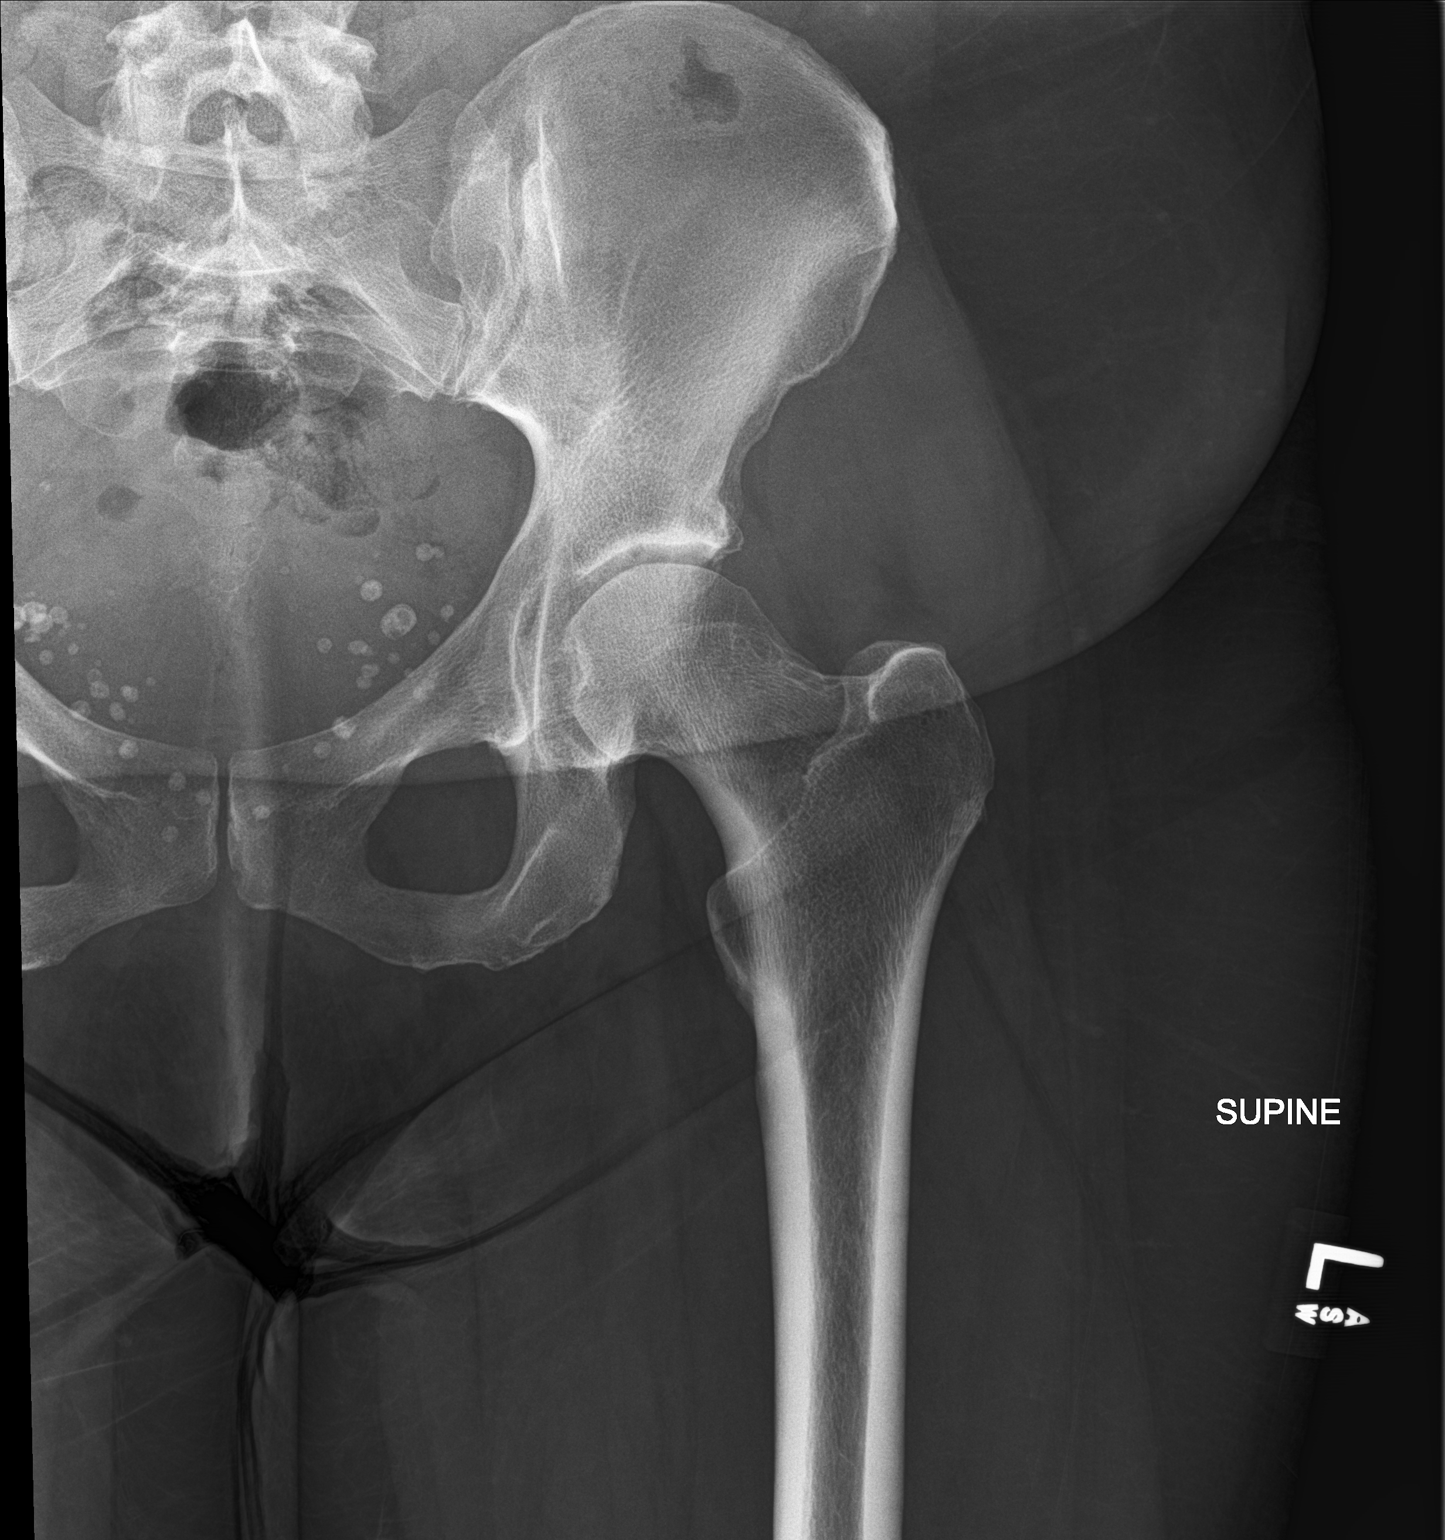

[hip lat]
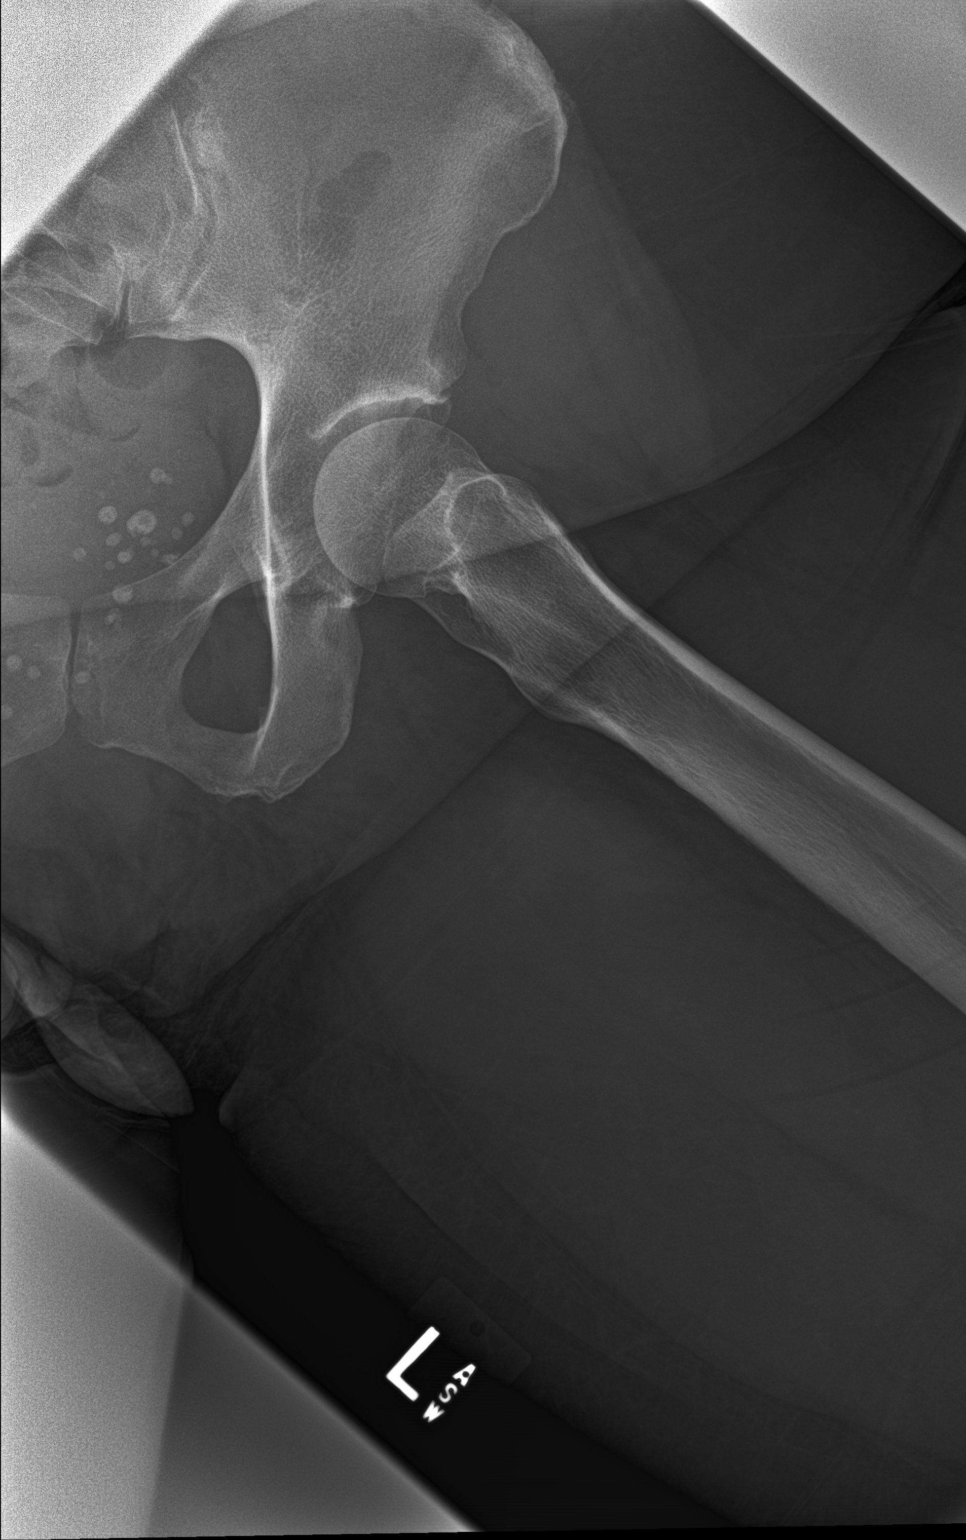

[3 of 3 positions shown; findings below may reference images not displayed]

FINDINGS: Pelvic ring is intact. Multiple phleboliths are noted. No acute
fracture or dislocation is noted. No soft tissue abnormality is
seen.
IMPRESSION: No acute abnormality noted.

## 2023-03-15 IMAGING — CR DG SHOULDER 2+V*L*
3 series · 3 of 3 positions shown · non-contrast
Comparison: None.

CLINICAL DATA: Trip and fall this morning with left shoulder pain,
initial encounter

EXAM:
LEFT SHOULDER - 2+ VIEW

[shoulder grashey]
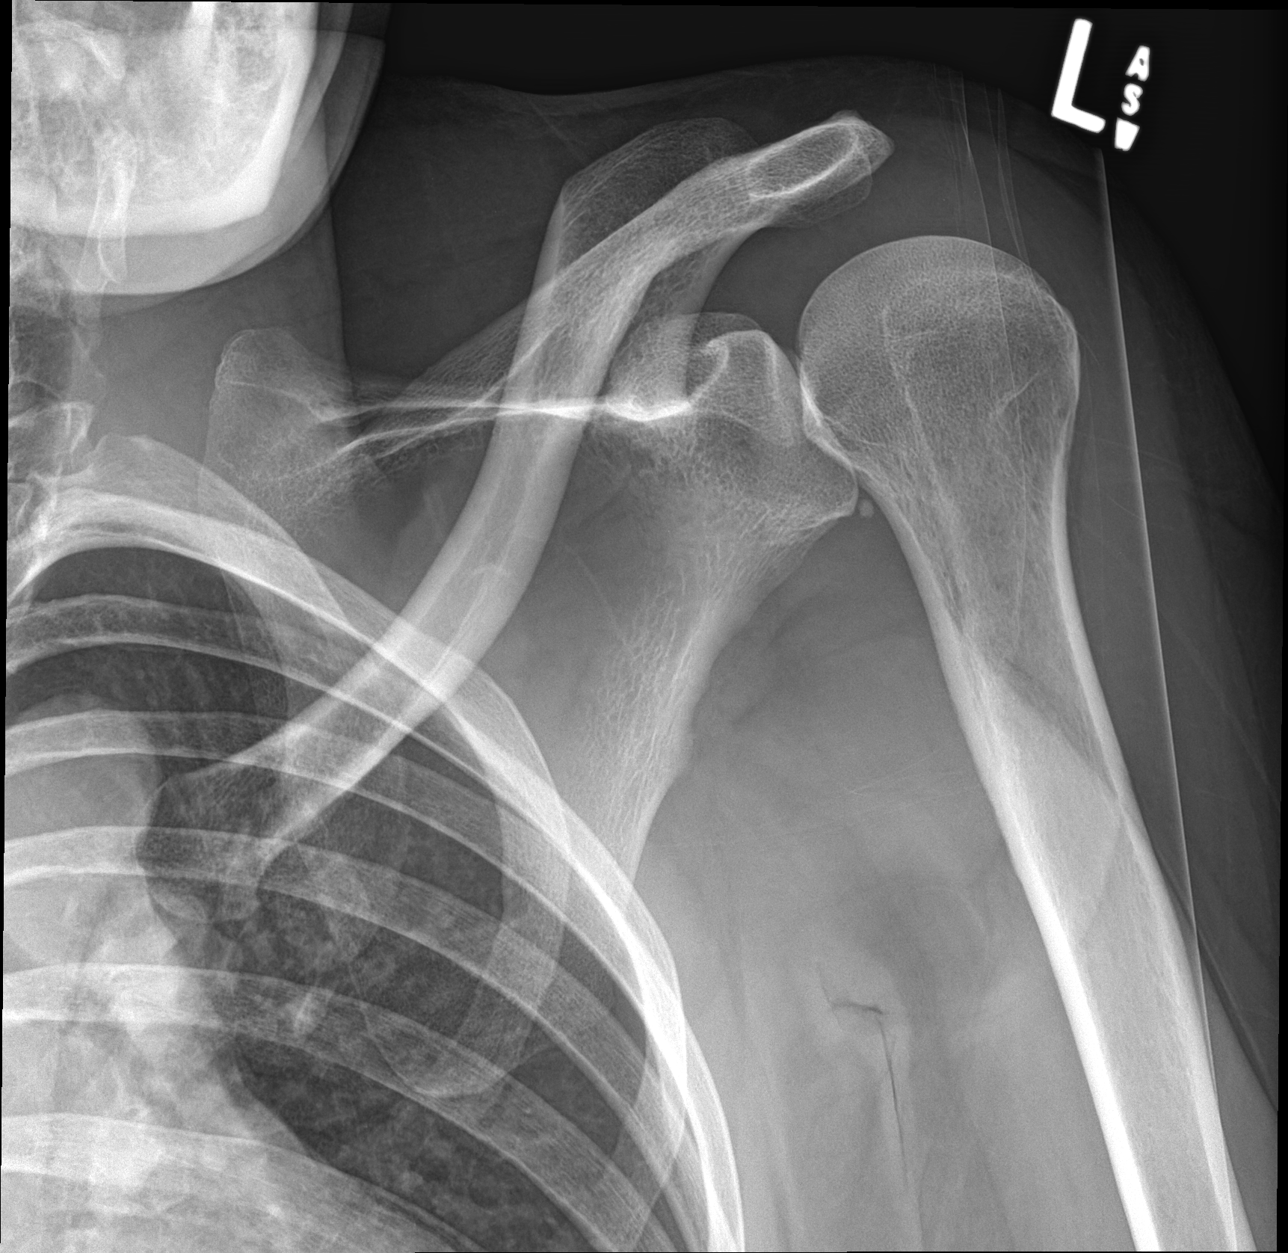

[shoulder axial]
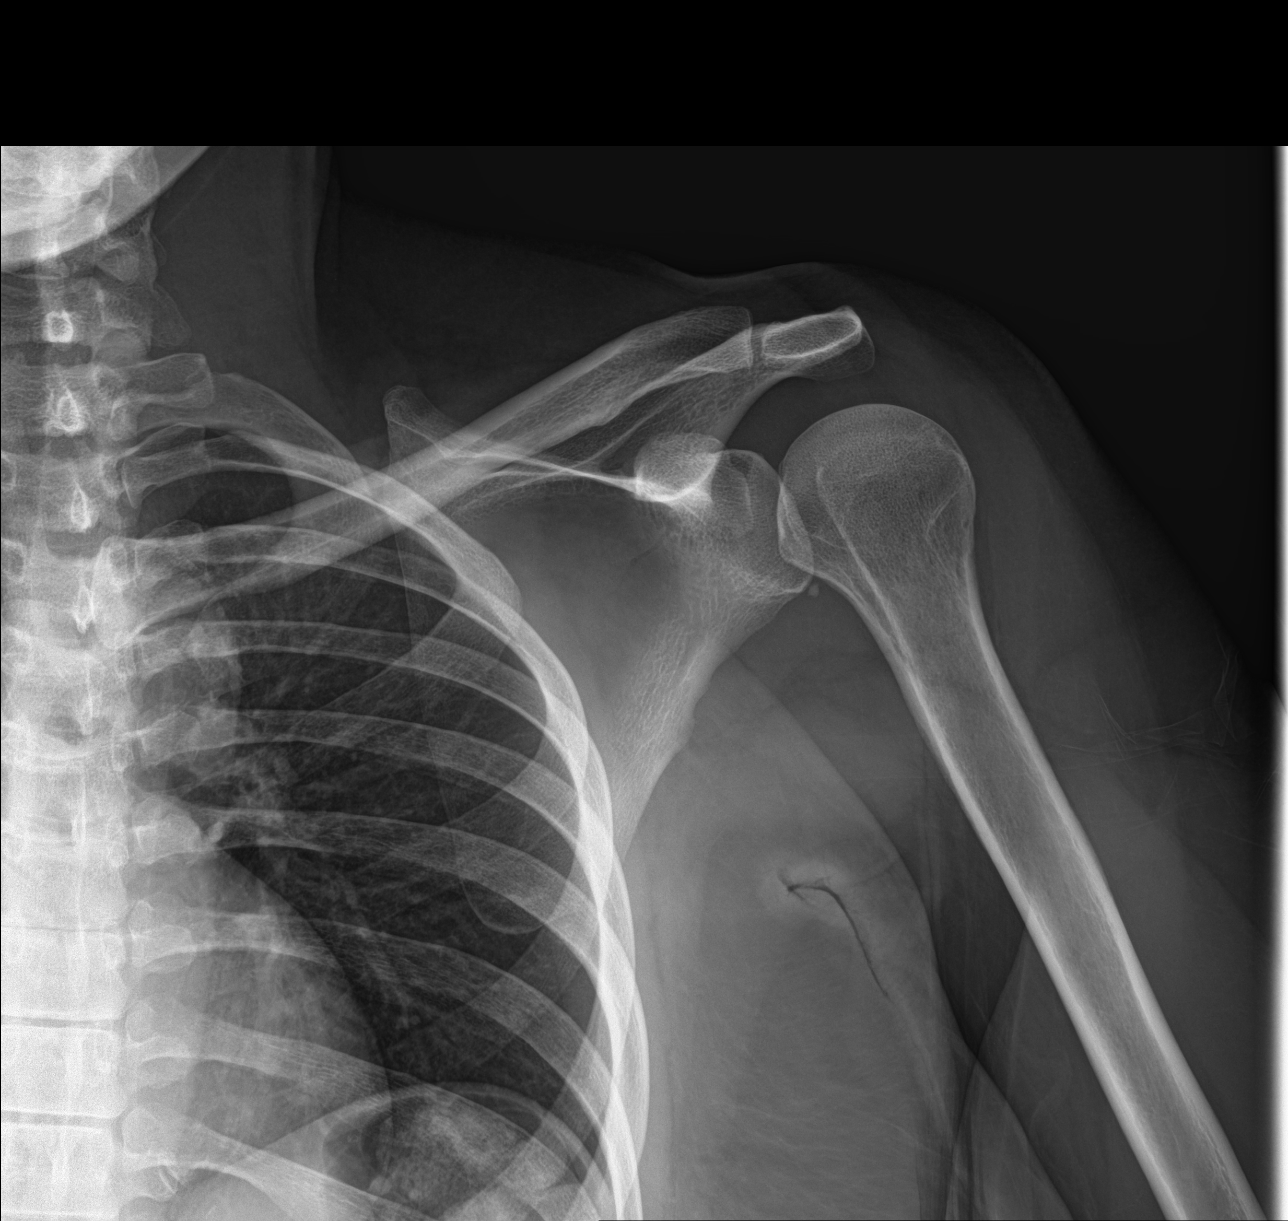

[shoulder y view]
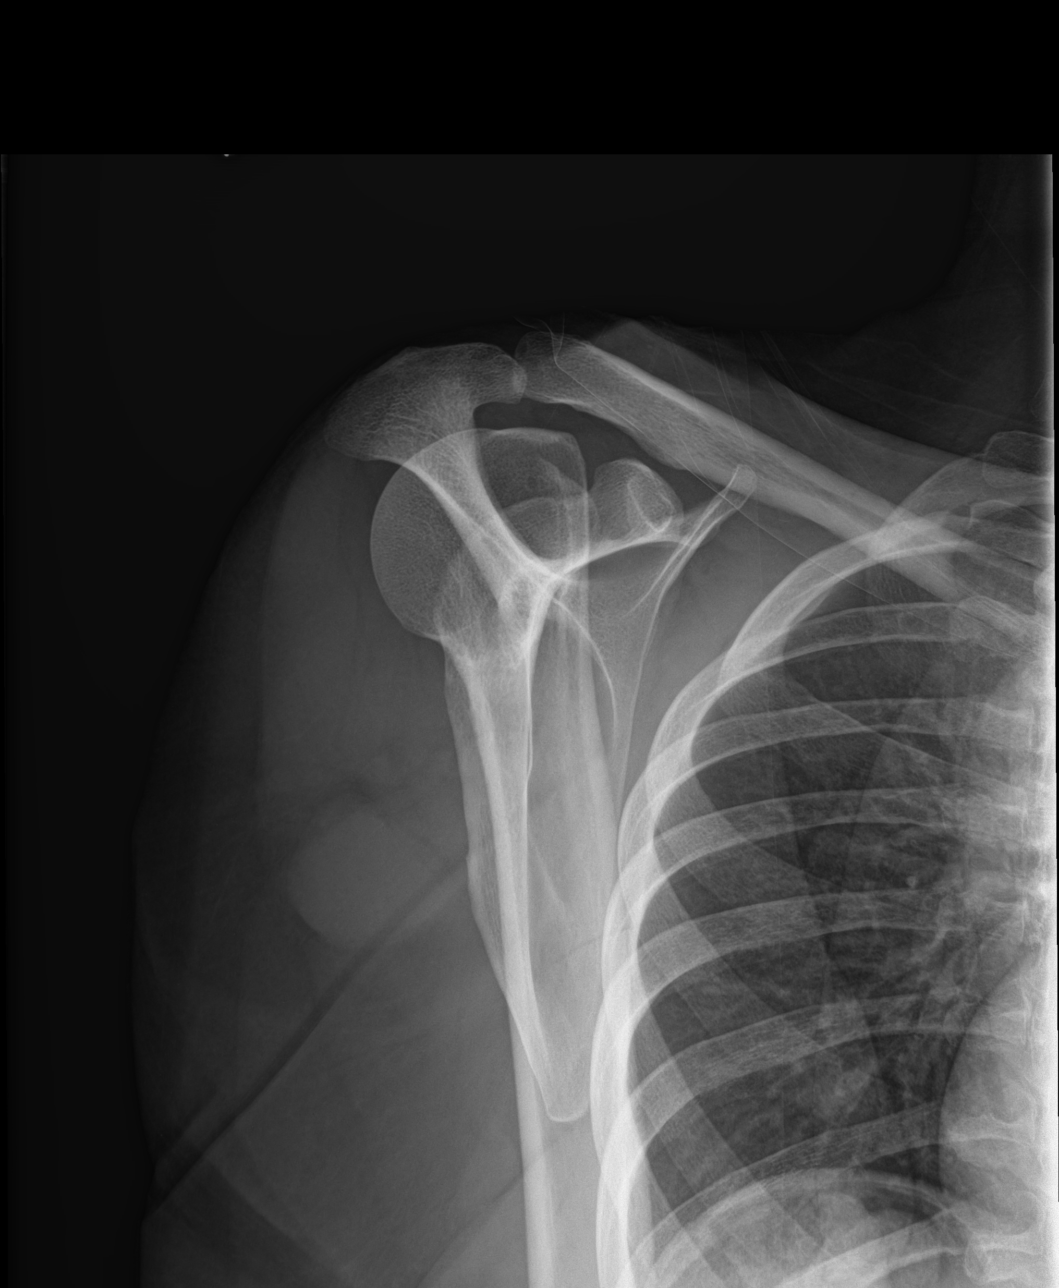

[3 of 3 positions shown; findings below may reference images not displayed]

FINDINGS: No acute fracture or dislocation is noted. No soft tissue
abnormality is noted. Small calcification is noted along the
inferior aspect of the glenoid of uncertain chronicity. No other
focal abnormality is noted.
IMPRESSION: No acute abnormality seen.

## 2023-05-02 ENCOUNTER — Inpatient Hospital Stay: Payer: BC Managed Care – PPO | Admitting: Oncology

## 2023-05-02 ENCOUNTER — Inpatient Hospital Stay: Payer: BC Managed Care – PPO | Attending: Oncology

## 2023-10-18 ENCOUNTER — Ambulatory Visit: Payer: BC Managed Care – PPO

## 2023-10-18 ENCOUNTER — Encounter: Payer: Self-pay | Admitting: Oncology

## 2023-10-18 DIAGNOSIS — Z1211 Encounter for screening for malignant neoplasm of colon: Secondary | ICD-10-CM | POA: Diagnosis present

## 2023-10-18 DIAGNOSIS — D120 Benign neoplasm of cecum: Secondary | ICD-10-CM | POA: Diagnosis not present

## 2023-10-18 DIAGNOSIS — K641 Second degree hemorrhoids: Secondary | ICD-10-CM | POA: Diagnosis not present

## 2023-11-26 ENCOUNTER — Encounter: Payer: Self-pay | Admitting: Oncology
